# Patient Record
Sex: Female | Born: 1955 | Race: White | Hispanic: No | Marital: Married | Smoking: Current some day smoker
Health system: Southern US, Community
[De-identification: ages and names within clinical notes are randomized; demographics above are authoritative.]

## PROBLEM LIST (undated history)

## (undated) DIAGNOSIS — M1990 Unspecified osteoarthritis, unspecified site: Secondary | ICD-10-CM

## (undated) HISTORY — DX: Unspecified osteoarthritis, unspecified site: M19.90

---

## 1999-05-08 ENCOUNTER — Inpatient Hospital Stay (HOSPITAL_COMMUNITY): Admission: AD | Admit: 1999-05-08 | Discharge: 1999-05-08 | Payer: Self-pay | Admitting: Obstetrics & Gynecology

## 2000-04-09 ENCOUNTER — Inpatient Hospital Stay (HOSPITAL_COMMUNITY): Admission: EM | Admit: 2000-04-09 | Discharge: 2000-04-09 | Payer: Self-pay | Admitting: *Deleted

## 2001-12-16 ENCOUNTER — Other Ambulatory Visit: Admission: RE | Admit: 2001-12-16 | Discharge: 2001-12-16 | Payer: Self-pay | Admitting: Gynecology

## 2002-06-06 ENCOUNTER — Ambulatory Visit (HOSPITAL_COMMUNITY): Admission: RE | Admit: 2002-06-06 | Discharge: 2002-06-06 | Payer: Self-pay | Admitting: General Surgery

## 2002-06-06 ENCOUNTER — Encounter: Payer: Self-pay | Admitting: General Surgery

## 2002-06-21 ENCOUNTER — Ambulatory Visit: Admission: RE | Admit: 2002-06-21 | Discharge: 2002-08-25 | Payer: Self-pay | Admitting: Radiation Oncology

## 2002-06-28 ENCOUNTER — Encounter: Payer: Self-pay | Admitting: *Deleted

## 2002-06-28 ENCOUNTER — Ambulatory Visit (HOSPITAL_COMMUNITY): Admission: RE | Admit: 2002-06-28 | Discharge: 2002-06-28 | Payer: Self-pay | Admitting: *Deleted

## 2002-07-10 ENCOUNTER — Encounter: Payer: Self-pay | Admitting: *Deleted

## 2002-07-10 ENCOUNTER — Ambulatory Visit (HOSPITAL_COMMUNITY): Admission: RE | Admit: 2002-07-10 | Discharge: 2002-07-10 | Payer: Self-pay | Admitting: *Deleted

## 2002-09-20 ENCOUNTER — Encounter: Payer: Self-pay | Admitting: *Deleted

## 2002-09-20 ENCOUNTER — Ambulatory Visit (HOSPITAL_COMMUNITY): Admission: RE | Admit: 2002-09-20 | Discharge: 2002-09-20 | Payer: Self-pay | Admitting: *Deleted

## 2002-10-20 ENCOUNTER — Ambulatory Visit (HOSPITAL_COMMUNITY): Admission: RE | Admit: 2002-10-20 | Discharge: 2002-10-20 | Payer: Self-pay | Admitting: General Surgery

## 2004-05-13 ENCOUNTER — Emergency Department (HOSPITAL_COMMUNITY): Admission: EM | Admit: 2004-05-13 | Discharge: 2004-05-13 | Payer: Self-pay | Admitting: Emergency Medicine

## 2004-06-26 ENCOUNTER — Ambulatory Visit (HOSPITAL_COMMUNITY): Admission: RE | Admit: 2004-06-26 | Discharge: 2004-06-26 | Payer: Self-pay | Admitting: Neurology

## 2004-09-05 ENCOUNTER — Ambulatory Visit (HOSPITAL_COMMUNITY): Admission: RE | Admit: 2004-09-05 | Discharge: 2004-09-05 | Payer: Self-pay | Admitting: Neurology

## 2004-09-15 ENCOUNTER — Emergency Department (HOSPITAL_COMMUNITY): Admission: EM | Admit: 2004-09-15 | Discharge: 2004-09-16 | Payer: Self-pay | Admitting: Emergency Medicine

## 2004-09-16 ENCOUNTER — Ambulatory Visit: Payer: Self-pay | Admitting: Cardiology

## 2004-09-25 ENCOUNTER — Ambulatory Visit: Payer: Self-pay

## 2004-09-25 ENCOUNTER — Ambulatory Visit: Payer: Self-pay | Admitting: Cardiology

## 2004-10-20 ENCOUNTER — Encounter: Admission: RE | Admit: 2004-10-20 | Discharge: 2005-01-18 | Payer: Self-pay | Admitting: Neurology

## 2004-10-30 ENCOUNTER — Ambulatory Visit: Payer: Self-pay | Admitting: Cardiology

## 2004-12-31 ENCOUNTER — Ambulatory Visit (HOSPITAL_COMMUNITY): Admission: RE | Admit: 2004-12-31 | Discharge: 2004-12-31 | Payer: Self-pay | Admitting: Neurology

## 2005-04-20 ENCOUNTER — Ambulatory Visit: Payer: Self-pay | Admitting: Oncology

## 2006-02-17 ENCOUNTER — Encounter: Payer: Self-pay | Admitting: *Deleted

## 2006-04-08 ENCOUNTER — Ambulatory Visit: Payer: Self-pay | Admitting: Oncology

## 2010-11-09 ENCOUNTER — Encounter: Payer: Self-pay | Admitting: Oncology

## 2012-03-22 ENCOUNTER — Ambulatory Visit (INDEPENDENT_AMBULATORY_CARE_PROVIDER_SITE_OTHER): Payer: PRIVATE HEALTH INSURANCE | Admitting: Physician Assistant

## 2012-03-22 ENCOUNTER — Other Ambulatory Visit: Payer: Self-pay | Admitting: Physician Assistant

## 2012-03-22 ENCOUNTER — Ambulatory Visit: Payer: PRIVATE HEALTH INSURANCE

## 2012-03-22 VITALS — BP 135/74 | HR 78 | Temp 98.8°F | Resp 18 | Ht 66.0 in | Wt 185.0 lb

## 2012-03-22 DIAGNOSIS — M2550 Pain in unspecified joint: Secondary | ICD-10-CM

## 2012-03-22 LAB — POCT CBC
Granulocyte percent: 56.5 %G (ref 37–80)
MCH, POC: 29.8 pg (ref 27–31.2)
MCHC: 32.1 g/dL (ref 31.8–35.4)
MCV: 92.7 fL (ref 80–97)
RDW, POC: 13 %
WBC: 5.5 10*3/uL (ref 4.6–10.2)

## 2012-03-22 LAB — COMPREHENSIVE METABOLIC PANEL
ALT: 17 U/L (ref 0–35)
Albumin: 4 g/dL (ref 3.5–5.2)
Alkaline Phosphatase: 113 U/L (ref 39–117)
CO2: 27 mEq/L (ref 19–32)
Glucose, Bld: 97 mg/dL (ref 70–99)
Sodium: 141 mEq/L (ref 135–145)
Total Protein: 7.1 g/dL (ref 6.0–8.3)

## 2012-03-22 LAB — URIC ACID: Uric Acid, Serum: 4.9 mg/dL (ref 2.4–7.0)

## 2012-03-22 MED ORDER — INDOMETHACIN 50 MG PO CAPS: 50.0000 mg | ORAL_CAPSULE | Freq: Three times a day (TID) | ORAL | Status: AC

## 2012-03-22 NOTE — Progress Notes (Signed)
  Subjective:    Patient ID: Anita Young, female    DOB: April 17, 1956, 56 y.o.   MRN: [id number]  HPI Ms. Strohecker comes in today complaining of joint swelling and pain of right hand for 2 days.  She notes that most swelling and pain is in the 2nd MCP and then the 3rd MCP.  This has never happened before and she does not remember an injury. She denies any other arthralgias, fever or chills, myalgias. She says it feels a little warm and is most painful and stiff first thing in the morning.  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 She says she is in good health and only takes Vit D but she has not had routine exams and lab work recently.    Review of Systems As noted above in HPI    Objective:   Physical Exam  Constitutional: She is oriented to person, place, and time. She appears well-developed and well-nourished.  Cardiovascular: Normal rate and regular rhythm.   Pulmonary/Chest: Effort normal and breath sounds normal.  Musculoskeletal:       Right hand: She exhibits tenderness, bony tenderness and swelling. She exhibits normal capillary refill, no deformity and no laceration. normal sensation noted. Normal strength noted.       2nd and 3rd MCP with swelling noted.  Area is tender to light touch and she notes pain with flexion of joints. Minimal if any erythema and 2nd MCP slightly warm.  No other joints noted to be swollen.     Neurological: She is alert and oriented to person, place, and time.  Skin: No ecchymosis noted. No erythema.    [facility] reading (PRIMARY) by  Dr. Patsy Lager. Right hand Negative   Results for orders placed in visit on 03/22/12  POCT CBC      Component Value Range   WBC 5.5  4.6 - 10.2 (K/uL)   Lymph, poc 1.8  0.6 - 3.4    POC LYMPH PERCENT 33.6  10 - 50 (%L)   MID (cbc) 0.5  0 - 0.9    POC MID % 9.9  0 - 12 (%M)   POC Granulocyte 3.1  2 - 6.9    Granulocyte percent 56.5  37 - 80 (%G)   RBC 4.13  4.04 - 5.48 (M/uL)   Hemoglobin  12.3  12.2 - 16.2 (g/dL)   HCT, POC 40.9  81.1 - 47.9 (%)   MCV 92.7  80 - 97 (fL)   MCH, POC 29.8  27 - 31.2 (pg)   MCHC 32.1  31.8 - 35.4 (g/dL)   RDW, POC 91.4     Platelet Count, POC 209  142 - 424 (K/uL)   MPV 8.9  0 - 99.8 (fL)       Assessment & Plan:  Arthralgias, 2nd and 3rd right MCP, ? Gout  CMET, Uric Acid, Sed Rate Indocin 50 tid with food.   Gout information provided Return if symptoms worsen.

## 2012-03-22 NOTE — Patient Instructions (Signed)
Take indomethacin for pain and swelling as directed with food.   Call or return if symptoms worsen.  Gout Gout is an inflammatory condition (arthritis) caused by a buildup of uric acid crystals in the joints. Uric acid is a chemical that is normally present in the blood. Under some circumstances, uric acid can form into crystals in your joints. This causes joint redness, soreness, and swelling (inflammation). Repeat attacks are common. Over time, uric acid crystals can form into masses (tophi) near a joint, causing disfigurement. Gout is treatable and often preventable. CAUSES  The disease begins with elevated levels of uric acid in the blood. Uric acid is produced by your body when it breaks down a naturally found substance called purines. This also happens when you eat certain foods such as meats and fish. Causes of an elevated uric acid level include:  Being passed down from parent to child (heredity).   Diseases that cause increased uric acid production (obesity, psoriasis, some cancers).   Excessive alcohol use.   Diet, especially diets rich in meat and seafood.   Medicines, including certain cancer-fighting drugs (chemotherapy), diuretics, and aspirin.   Chronic kidney disease. The kidneys are no longer able to remove uric acid well.   Problems with metabolism.  Conditions strongly associated with gout include:  Obesity.   High blood pressure.   High cholesterol.   Diabetes.  Not everyone with elevated uric acid levels gets gout. It is not understood why some people get gout and others do not. Surgery, joint injury, and eating too much of certain foods are some of the factors that can lead to gout. SYMPTOMS   An attack of gout comes on quickly. It causes intense pain with redness, swelling, and warmth in a joint.   Fever can occur.   Often, only one joint is involved. Certain joints are more commonly involved:   Base of the big toe.   Knee.   Ankle.   Wrist.    Finger.  Without treatment, an attack usually goes away in a few days to weeks. Between attacks, you usually will not have symptoms, which is different from many other forms of arthritis. DIAGNOSIS  Your caregiver will suspect gout based on your symptoms and exam. Removal of fluid from the joint (arthrocentesis) is done to check for uric acid crystals. Your caregiver will give you a medicine that numbs the area (local anesthetic) and use a needle to remove joint fluid for exam. Gout is confirmed when uric acid crystals are seen in joint fluid, using a special microscope. Sometimes, blood, urine, and X-ray tests are also used. TREATMENT  There are 2 phases to gout treatment: treating the sudden onset (acute) attack and preventing attacks (prophylaxis). Treatment of an Acute Attack  Medicines are used. These include anti-inflammatory medicines or steroid medicines.   An injection of steroid medicine into the affected joint is sometimes necessary.   The painful joint is rested. Movement can worsen the arthritis.   You may use warm or cold treatments on painful joints, depending which works best for you.   Discuss the use of coffee, vitamin C, or cherries with your caregiver. These may be helpful treatment options.  Treatment to Prevent Attacks After the acute attack subsides, your caregiver may advise prophylactic medicine. These medicines either help your kidneys eliminate uric acid from your body or decrease your uric acid production. You may need to stay on these medicines for a very long time. The early phase of treatment with prophylactic  medicine can be associated with an increase in acute gout attacks. For this reason, during the first few months of treatment, your caregiver may also advise you to take medicines usually used for acute gout treatment. Be sure you understand your caregiver's directions. You should also discuss dietary treatment with your caregiver. Certain foods such as  meats and fish can increase uric acid levels. Other foods such as dairy can decrease levels. Your caregiver can give you a list of foods to avoid. HOME CARE INSTRUCTIONS   Do not take aspirin to relieve pain. This raises uric acid levels.   Only take over-the-counter or prescription medicines for pain, discomfort, or fever as directed by your caregiver.   Rest the joint as much as possible. When in bed, keep sheets and blankets off painful areas.   Keep the affected joint raised (elevated).   Use crutches if the painful joint is in your leg.   Drink enough water and fluids to keep your urine clear or pale yellow. This helps your body get rid of uric acid. Do not drink alcoholic beverages. They slow the passage of uric acid.   Follow your caregiver's dietary instructions. Pay careful attention to the amount of protein you eat. Your daily diet should emphasize fruits, vegetables, whole grains, and fat-free or low-fat milk products.   Maintain a healthy body weight.  SEEK MEDICAL CARE IF:   You have an oral temperature above 102 F (38.9 C).   You develop diarrhea, vomiting, or any side effects from medicines.   You do not feel better in 24 hours, or you are getting worse.  SEEK IMMEDIATE MEDICAL CARE IF:   Your joint becomes suddenly more tender and you have:   Chills.   An oral temperature above 102 F (38.9 C), not controlled by medicine.  MAKE SURE YOU:   Understand these instructions.   Will watch your condition.   Will get help right away if you are not doing well or get worse.  Document Released: 10/02/2000 Document Revised: 09/24/2011 Document Reviewed: 01/13/2010 [facility] Patient Information 2012 [place], [place].

## 2012-03-23 LAB — RHEUMATOID FACTOR: Rhuematoid fact SerPl-aCnc: 503 IU/mL — ABNORMAL HIGH (ref <=14)

## 2012-03-24 ENCOUNTER — Telehealth: Payer: Self-pay

## 2012-03-24 LAB — ANA: Anti Nuclear Antibody(ANA): POSITIVE — AB

## 2012-03-24 LAB — ANTI-NUCLEAR AB-TITER (ANA TITER): ANA Titer 1: 1:40 {titer} — ABNORMAL HIGH

## 2012-03-24 NOTE — Telephone Encounter (Signed)
Pt CB to get lab results. I explained the results to date and that Helmut Muster has ordered some add'l tests. Pt stated that she is feeling about the same as when she was here and will wait to hear about add'l tests when they are back. See notes under lab results.

## 2012-03-26 ENCOUNTER — Encounter: Payer: Self-pay | Admitting: Radiology

## 2012-04-01 ENCOUNTER — Telehealth: Payer: Self-pay

## 2012-04-01 DIAGNOSIS — R768 Other specified abnormal immunological findings in serum: Secondary | ICD-10-CM

## 2012-04-01 NOTE — Telephone Encounter (Signed)
Patient notified about elevated sed rate, rheum factor and positive ANA.  Of with referral to rheum and does not have preference.  Will refer.

## 2012-04-01 NOTE — Telephone Encounter (Signed)
Pt states that we sent her a letter and she is wanting someone to give her a call back regarding her lab results.  Patient's states that it is best to call her after 12pm.

## 2012-04-02 ENCOUNTER — Ambulatory Visit (INDEPENDENT_AMBULATORY_CARE_PROVIDER_SITE_OTHER): Payer: PRIVATE HEALTH INSURANCE | Admitting: Physician Assistant

## 2012-04-02 VITALS — BP 144/74 | HR 101 | Temp 98.9°F | Resp 16 | Ht 64.0 in | Wt 185.0 lb

## 2012-04-02 DIAGNOSIS — M79609 Pain in unspecified limb: Secondary | ICD-10-CM

## 2012-04-02 DIAGNOSIS — M79643 Pain in unspecified hand: Secondary | ICD-10-CM

## 2012-04-02 MED ORDER — PREDNISONE 20 MG PO TABS: ORAL_TABLET | ORAL | Status: DC

## 2012-04-02 MED ORDER — HYDROCODONE-ACETAMINOPHEN 5-325 MG PO TABS: 1.0000 | ORAL_TABLET | ORAL | Status: AC | PRN

## 2012-04-02 NOTE — Patient Instructions (Signed)
Rheumatoid Arthritis Rheumatoid arthritis is a long-term (chronic) inflammatory disease that causes pain, swelling, and stiffness of the joints. It can affect the entire body. The effects of rheumatoid arthritis vary widely among those with the condition. CAUSES  The cause of rheumatoid arthritis is not known. It tends to run in families and is more common in women. Certain cells of the body's natural defense system (immune system) do not work properly and begin to attack healthy joints. It primarily involves the connective tissue that lines the joints (synovial membrane). This can cause damage to the joint. SYMPTOMS   Pain, stiffness, swelling, and decreased motion of many joints, especially in the hands and feet.   Stiffness that is worse in the morning. It may last 1 to 2 hours or longer.   Fatigue.   Loss of appetite.   Low-grade fever.   Dry eyes and mouth.   Firm lumps (rheumatoid nodules) that grow beneath the skin in areas such as the elbows and hands.  DIAGNOSIS  Diagnosis is based on the symptoms described, exam, and blood tests. Sometimes, X-rays are helpful. TREATMENT  The goals of treatment are to relieve pain, reduce inflammation, and slow down or stop joint damage and disability. Methods vary and may include:  Maintaining a balance of rest, exercise, and proper nutrition.   Medicines:   Pain relievers (analgesics).   Corticosteroids and nonsteroidal anti-inflammatory drugs (NSAIDs) to reduce inflammation.   Disease-modifying antirheumatic drugs (DMARDs) to try to slow the course of the disease.   Biologic response modifiers to reduce inflammation and damage.   Surgery for patients with severe joint damage. Joint replacement or fusing of joints may be needed.   Routine monitoring and ongoing care, such as office visits, blood and urine tests, and x-rays.  HOME CARE INSTRUCTIONS   Remain physically active and reduce activity when the disease gets worse.   Eat a  well-balanced diet.   Use heat on affected joints when you wake up and before activities.   Use ice on affected joints following activities or exercising.   Take all medicines and supplements as directed by your caregiver.   Use splints as your caregiver prescribes. Splints help maintain joint position and function.   Do not sleep with pillows under your knees. This may lead to spasms.   Participate in a self-management program to keep current with the latest treatment and coping skills.  SEEK IMMEDIATE MEDICAL CARE IF:  You have fainting episodes.   You have periods of extreme weakness.   A hot, painful joint develops rapidly and is more severe than usual joint aches.   You develop chills.   You have a fever.  FOR MORE INFORMATION  American College of Rheumatology: www.rheumatology.org Arthritis Foundation: www.arthritis.org Document Released: 10/02/2000 Document Revised: 09/24/2011 Document Reviewed: 01/16/2010 [facility] Patient Information 2012 [place], [place].  Lupus Lupus (also called systemic lupus erythematosus, SLE) is a disorder of the body's natural defense system (immune system). In lupus, the immune system attacks various areas of the body (autoimmune disease). CAUSES The cause is unknown. However, lupus runs in families. Certain genes can make you more likely to develop lupus. It is 10 times more common in women than in men. Lupus is also more common in African Americans and Asians. Other factors also play a role, such as viruses (Epstein-Barr virus, EBV), stress, hormones, cigarette smoke, and certain drugs. SYMPTOMS Lupus can affect many parts of the body, including the joints, skin, kidneys, lungs, heart, nervous system, and blood vessels.  The signs and symptoms of lupus differ from person to person. The disease can range from mild to life-threatening. Typical features of lupus include:  Butterfly-shaped rash over the face.   Arthritis involving one or more  joints.   Kidney disease.   Fever, weight loss, hair loss, fatigue.   Poor circulation in the fingers and toes (Raynaud's disease).   Chest pain when taking deep breaths. Abdominal pain may also occur.   Skin rash in areas exposed to the sun.   Sores in the mouth and nose.  DIAGNOSIS Diagnosing lupus can take a long time and is often difficult. An exam and an accurate account of your symptoms and health problems is very important. Blood tests are necessary, though no single test can confirm or rule out lupus. Most people with lupus test positive for antinuclear antibodies (ANA) on a blood test. Additional blood tests, a urine test (urinalysis), and sometimes a kidney or skin tissue sample (biopsy) can help to confirm or rule out lupus. TREATMENT There is no cure for lupus. Your caregiver will develop a treatment plan based on your age, sex, health, symptoms, and lifestyle. The goals are to prevent flares, to treat them when they do occur, and to minimize organ damage and complications. How the disease may affect each person varies widely. Most people with lupus can live normal lives, but this disorder must be carefully monitored. Treatment must be adjusted as necessary to prevent serious complications. Medicines used for treatment:  Nonsteroidal anti-inflammatory drugs (NSAIDs) decrease inflammation and can help with chest pain, joint pain, and fevers. Examples include ibuprofen and naproxen.   Antimalarial drugs were designed to treat malaria. They also treat fatigue, joint pain, skin rashes, and inflammation of the lungs in patients with lupus.   Corticosteroids are powerful hormones that rapidly suppress inflammation. The lowest dose with the highest benefit will be chosen. They can be given by cream, pills, injections, and through the vein (intravenously).   Immunosuppressive drugs block the making of immune cells. They may be used for kidney or nerve disease.  HOME CARE  INSTRUCTIONS  Exercise. Low-impact activities can usually help keep joints flexible without being too strenuous.   Rest after periods of exercise.   Avoid excessive sun exposure.   Follow proper nutrition and take supplements as recommended by your caregiver.   Stress management can be helpful.  SEEK MEDICAL CARE IF:  You have increased fatigue.   You develop pain.   You develop a rash.   You have an oral temperature above 102 F (38.9 C).   You develop abdominal discomfort.   You develop a headache.   You experience dizziness.  FOR MORE INFORMATION National Institute of Neurological Disorders and Stroke: [employer] of Rheumatology: www.rheumatology.[employer] of Arthritis and Musculoskeletal and Skin Diseases: www.niams.http://www.myers.net/ Document Released: 09/25/2002 Document Revised: 09/24/2011 Document Reviewed: 01/16/2010 [facility] Patient Information 2012 [place], [place].

## 2012-04-02 NOTE — Progress Notes (Signed)
  Subjective:    Patient ID: Anita Young, female    DOB: 1956-08-11, 56 y.o.   MRN: [id number]  HPI Anita Young comes in today for follow up of her right hand pain.  Since her last visit she has taken the Indocin with some relief but her pain is worse now.  She was contacted several times to notify her of lab results but she was out of town so she was not aware of the positive RF and ANA found at the last visit.  She denies any history of previous joint problems and notes that there is no known family history of arthritis. Her right hand, 2nd MCP is painful and swollen.  No redness but feels warm.  She has not had a complete physical in years.  Review of Systems As noted in HPI    Objective:   Physical Exam  Right hand, 2nd MCP with swelling and tenderness on palpation.  Pain with ROM.  No warmth or redness.      Assessment & Plan:  Hand pain Hand swelling with posititve ANA and RF  Prednisone taper and Hydrocodone for pain. Refer to rheumatology Needs CPE

## 2012-04-06 ENCOUNTER — Ambulatory Visit (INDEPENDENT_AMBULATORY_CARE_PROVIDER_SITE_OTHER): Payer: PRIVATE HEALTH INSURANCE | Admitting: Physician Assistant

## 2012-04-06 VITALS — BP 148/79 | HR 74 | Temp 98.1°F | Resp 17 | Ht 64.5 in | Wt 185.0 lb

## 2012-04-06 DIAGNOSIS — R232 Flushing: Secondary | ICD-10-CM

## 2012-04-06 DIAGNOSIS — T887XXA Unspecified adverse effect of drug or medicament, initial encounter: Secondary | ICD-10-CM

## 2012-04-06 NOTE — Progress Notes (Signed)
  Subjective:    Patient ID: Anita Young, female    DOB: 10-05-1956, 56 y.o.   MRN: [id number]  HPI Ms. Anita Young comes in today after waking this morning with swollen red face. No pain or itching. She is better now but was concerned.  She has no fever or chills.  No rash other areas.  No edema other areas.  She is now taking 2 Prednisone 20 mg tablets for her hand pain.  She has not taken her prednisone today.  Her hand is much better today.  Her rheumatology appointment has not been scheduled yet.   Review of Systems As above in HPI    Objective:   Physical Exam  Constitutional: She appears well-developed and well-nourished.  HENT:  Mouth/Throat: Oropharynx is clear and moist.  Cardiovascular: Normal rate and regular rhythm.   Pulmonary/Chest: Effort normal and breath sounds normal.  Musculoskeletal:       No edema   Skin: No rash noted. There is erythema.       Faint redness on bilateral cheeks and forehead.  No obvious swelling noted although pt says she is swollen. No rash other areas.       Assessment & Plan:  Medication SE Flushing  Feel her symptoms are SE of Prednisone.  I recommended her to just take 1 prednisone tablet today and then d/c.  If she has continued symptoms or new symptoms, she is to RTC.  Reviewed red flag symptoms.

## 2012-06-07 ENCOUNTER — Encounter: Payer: Self-pay | Admitting: Physician Assistant

## 2012-06-07 DIAGNOSIS — M2550 Pain in unspecified joint: Secondary | ICD-10-CM | POA: Insufficient documentation

## 2012-06-07 DIAGNOSIS — R768 Other specified abnormal immunological findings in serum: Secondary | ICD-10-CM | POA: Insufficient documentation

## 2012-09-20 ENCOUNTER — Ambulatory Visit (INDEPENDENT_AMBULATORY_CARE_PROVIDER_SITE_OTHER): Payer: PRIVATE HEALTH INSURANCE | Admitting: Internal Medicine

## 2012-09-20 VITALS — BP 156/92 | HR 108 | Temp 98.0°F | Resp 17 | Ht 64.0 in | Wt 188.0 lb

## 2012-09-20 DIAGNOSIS — R42 Dizziness and giddiness: Secondary | ICD-10-CM

## 2012-09-20 DIAGNOSIS — R079 Chest pain, unspecified: Secondary | ICD-10-CM

## 2012-09-20 DIAGNOSIS — R1110 Vomiting, unspecified: Secondary | ICD-10-CM

## 2012-09-20 DIAGNOSIS — Z87891 Personal history of nicotine dependence: Secondary | ICD-10-CM

## 2012-09-20 DIAGNOSIS — R197 Diarrhea, unspecified: Secondary | ICD-10-CM

## 2012-09-20 DIAGNOSIS — I10 Essential (primary) hypertension: Secondary | ICD-10-CM

## 2012-09-20 LAB — POCT URINALYSIS DIPSTICK
Leukocytes, UA: NEGATIVE
Protein, UA: 30

## 2012-09-20 LAB — POCT CBC
HCT, POC: 48.1 % — AB (ref 37.7–47.9)
Hemoglobin: 14.4 g/dL (ref 12.2–16.2)
MCV: 98.4 fL — AB (ref 80–97)
MPV: 9.5 fL (ref 0–99.8)
POC Granulocyte: 3.8 (ref 2–6.9)
POC MID %: 6.6 %M (ref 0–12)
Platelet Count, POC: 230 10*3/uL (ref 142–424)
RDW, POC: 13 %

## 2012-09-20 LAB — COMPREHENSIVE METABOLIC PANEL
Albumin: 4 g/dL (ref 3.5–5.2)
Alkaline Phosphatase: 111 U/L (ref 39–117)
BUN: 18 mg/dL (ref 6–23)
CO2: 26 mEq/L (ref 19–32)
Calcium: 9.5 mg/dL (ref 8.4–10.5)
Chloride: 106 mEq/L (ref 96–112)
Sodium: 143 mEq/L (ref 135–145)
Total Bilirubin: 0.4 mg/dL (ref 0.3–1.2)

## 2012-09-20 LAB — POCT UA - MICROSCOPIC ONLY
Mucus, UA: POSITIVE
Yeast, UA: NEGATIVE

## 2012-09-20 MED ORDER — METOPROLOL SUCCINATE ER 25 MG PO TB24: 25.0000 mg | ORAL_TABLET | Freq: Every day | ORAL | Status: DC

## 2012-09-20 MED ORDER — ONDANSETRON 4 MG PO TBDP
8.0000 mg | ORAL_TABLET | Freq: Once | ORAL | Status: AC
  Administered 2012-09-20: 8 mg via ORAL

## 2012-09-20 MED ORDER — ONDANSETRON HCL 8 MG PO TABS: 8.0000 mg | ORAL_TABLET | Freq: Three times a day (TID) | ORAL | Status: DC | PRN

## 2012-09-20 NOTE — Progress Notes (Signed)
  Subjective:    Patient ID: Anita Young, female    DOB: 1956/03/23, 56 y.o.   MRN: [id number]  HPI Vomiting, diarrhea, dizzy, chest pressure of gradual onset last night. No fhx of heart disease. Needs note for work BP elevation every visit  Review of Systems     Objective:   Physical Exam 156/92  Results for orders placed in visit on 09/20/12  POCT CBC      Component Value Range   WBC 6.0  4.6 - 10.2 K/uL   Lymph, poc 1.8  0.6 - 3.4   POC LYMPH PERCENT 30.7  10 - 50 %L   MID (cbc) 0.4  0 - 0.9   POC MID % 6.6  0 - 12 %M   POC Granulocyte 3.8  2 - 6.9   Granulocyte percent 62.7  37 - 80 %G   RBC 4.89  4.04 - 5.48 M/uL   Hemoglobin 14.4  12.2 - 16.2 g/dL   HCT, POC 81.1 (*) 91.4 - 47.9 %   MCV 98.4 (*) 80 - 97 fL   MCH, POC 29.4  27 - 31.2 pg   MCHC 29.9 (*) 31.8 - 35.4 g/dL   RDW, POC 78.2     Platelet Count, POC 230  142 - 424 K/uL   MPV 9.5  0 - 99.8 fL  POCT URINALYSIS DIPSTICK      Component Value Range   Color, UA amber     Clarity, UA cloudy     Glucose, UA negative     Bilirubin, UA small     Ketones, UA trace     Spec Grav, UA >=1.030     Blood, UA trace/intact     pH, UA 6.0     Protein, UA 30     Urobilinogen, UA 0.2     Nitrite, UA negative     Leukocytes, UA Negative          Assessment & Plan:  Zofran 8mg  prn Reck 48 hrs 10am Hydrate Metoprolol er 25 mg qd Keep home bp record

## 2012-09-20 NOTE — Patient Instructions (Addendum)
DASH Diet The DASH diet stands for "Dietary Approaches to Stop Hypertension." It is a healthy eating plan that has been shown to reduce high blood pressure (hypertension) in as little as 14 days, while also possibly providing other significant health benefits. These other health benefits include reducing the risk of breast cancer after menopause and reducing the risk of type 2 diabetes, heart disease, colon cancer, and stroke. Health benefits also include weight loss and slowing kidney failure in patients with chronic kidney disease.  DIET GUIDELINES  Limit salt (sodium). Your diet should contain less than 1500 mg of sodium daily.  Limit refined or processed carbohydrates. Your diet should include mostly whole grains. Desserts and added sugars should be used sparingly.  Include small amounts of heart-healthy fats. These types of fats include nuts, oils, and tub margarine. Limit saturated and trans fats. These fats have been shown to be harmful in the body. CHOOSING FOODS  The following food groups are based on a 2000 calorie diet. See your Registered Dietitian for individual calorie needs. Grains and Grain Products (6 to 8 servings daily)  Eat More Often: Whole-wheat bread, brown rice, whole-grain or wheat pasta, quinoa, popcorn without added fat or salt (air popped).  Eat Less Often: White bread, white pasta, white rice, cornbread. Vegetables (4 to 5 servings daily)  Eat More Often: Fresh, frozen, and canned vegetables. Vegetables may be raw, steamed, roasted, or grilled with a minimal amount of fat.  Eat Less Often/Avoid: Creamed or fried vegetables. Vegetables in a cheese sauce. Fruit (4 to 5 servings daily)  Eat More Often: All fresh, canned (in natural juice), or frozen fruits. Dried fruits without added sugar. One hundred percent fruit juice ( cup [237 mL] daily).  Eat Less Often: Dried fruits with added sugar. Canned fruit in light or heavy syrup. [employer], Fish, and Poultry (2  servings or less daily. One serving is 3 to 4 oz [85-114 g]).  Eat More Often: Ninety percent or leaner ground beef, tenderloin, sirloin. Round cuts of beef, chicken breast, [place] breast. All fish. Grill, bake, or broil your meat. Nothing should be fried.  Eat Less Often/Avoid: Fatty cuts of meat, [place], or chicken leg, thigh, or wing. Fried cuts of meat or fish. Dairy (2 to 3 servings)  Eat More Often: Low-fat or fat-free milk, low-fat plain or light yogurt, reduced-fat or part-skim cheese.  Eat Less Often/Avoid: Milk (whole, 2%).Whole milk yogurt. Full-fat cheeses. Nuts, Seeds, and Legumes (4 to 5 servings per week)  Eat More Often: All without added salt.  Eat Less Often/Avoid: Salted nuts and seeds, canned beans with added salt. Fats and Sweets (limited)  Eat More Often: Vegetable oils, tub margarines without trans fats, sugar-free gelatin. Mayonnaise and salad dressings.  Eat Less Often/Avoid: Coconut oils, palm oils, butter, stick margarine, cream, half and half, cookies, candy, pie. FOR MORE INFORMATION The Dash Diet Eating Plan: www.dashdiet.org Document Released: 09/24/2011 Document Revised: 12/28/2011 Document Reviewed: 09/24/2011 [facility] Patient Information 2013 [place], [place]. Viral Gastroenteritis Viral gastroenteritis is also known as stomach flu. This condition affects the stomach and intestinal tract. It can cause sudden diarrhea and vomiting. The illness typically lasts 3 to 8 days. Most people develop an immune response that eventually gets rid of the virus. While this natural response develops, the virus can make you quite ill. CAUSES  Many different viruses can cause gastroenteritis, such as rotavirus or noroviruses. You can catch one of these viruses by consuming contaminated food or water. You may also catch  a virus by sharing utensils or other personal items with an infected person or by touching a contaminated surface. SYMPTOMS  The most common symptoms are  diarrhea and vomiting. These problems can cause a severe loss of body fluids (dehydration) and a body salt (electrolyte) imbalance. Other symptoms may include:  Fever.  Headache.  Fatigue.  Abdominal pain. DIAGNOSIS  Your caregiver can usually diagnose viral gastroenteritis based on your symptoms and a physical exam. A stool sample may also be taken to test for the presence of viruses or other infections. TREATMENT  This illness typically goes away on its own. Treatments are aimed at rehydration. The most serious cases of viral gastroenteritis involve vomiting so severely that you are not able to keep fluids down. In these cases, fluids must be given through an intravenous line (IV). HOME CARE INSTRUCTIONS   Drink enough fluids to keep your urine clear or pale yellow. Drink small amounts of fluids frequently and increase the amounts as tolerated.  Ask your caregiver for specific rehydration instructions.  Avoid:  Foods high in sugar.  Alcohol.  Carbonated drinks.  Tobacco.  Juice.  Caffeine drinks.  Extremely hot or cold fluids.  Fatty, greasy foods.  Too much intake of anything at one time.  Dairy products until 24 to 48 hours after diarrhea stops.  You may consume probiotics. Probiotics are active cultures of beneficial bacteria. They may lessen the amount and number of diarrheal stools in adults. Probiotics can be found in yogurt with active cultures and in supplements.  Wash your hands well to avoid spreading the virus.  Only take over-the-counter or prescription medicines for pain, discomfort, or fever as directed by your caregiver. Do not give aspirin to children. Antidiarrheal medicines are not recommended.  Ask your caregiver if you should continue to take your regular prescribed and over-the-counter medicines.  Keep all follow-up appointments as directed by your caregiver. SEEK IMMEDIATE MEDICAL CARE IF:   You are unable to keep fluids down.  You do not  urinate at least once every 6 to 8 hours.  You develop shortness of breath.  You notice blood in your stool or vomit. This may look like coffee grounds.  You have abdominal pain that increases or is concentrated in one small area (localized).  You have persistent vomiting or diarrhea.  You have a fever.  The patient is a child younger than 3 months, and he or she has a fever.  The patient is a child older than 3 months, and he or she has a fever and persistent symptoms.  The patient is a child older than 3 months, and he or she has a fever and symptoms suddenly get worse.  The patient is a baby, and he or she has no tears when crying. MAKE SURE YOU:   Understand these instructions.  Will watch your condition.  Will get help right away if you are not doing well or get worse. Document Released: 10/05/2005 Document Revised: 12/28/2011 Document Reviewed: 07/22/2011 [facility] Patient Information 2013 [place], [place]. Hypertension As your heart beats, it forces blood through your arteries. This force is your blood pressure. If the pressure is too high, it is called hypertension (HTN) or high blood pressure. HTN is dangerous because you may have it and not know it. High blood pressure may mean that your heart has to work harder to pump blood. Your arteries may be narrow or stiff. The extra work puts you at risk for heart disease, stroke, and other problems.  Blood pressure consists of two numbers, a higher number over a lower, 110/72, for example. It is stated as "110 over 72." The ideal is below 120 for the top number (systolic) and under 80 for the bottom (diastolic). Write down your blood pressure today. You should pay close attention to your blood pressure if you have certain conditions such as:  Heart failure.  Prior heart attack.  Diabetes  Chronic kidney disease.  Prior stroke.  Multiple risk factors for heart disease. To see if you have HTN, your blood pressure should be  measured while you are seated with your arm held at the level of the heart. It should be measured at least twice. A one-time elevated blood pressure reading (especially in the Emergency Department) does not mean that you need treatment. There may be conditions in which the blood pressure is different between your right and left arms. It is important to see your caregiver soon for a recheck. Most people have essential hypertension which means that there is not a specific cause. This type of high blood pressure may be lowered by changing lifestyle factors such as:  Stress.  Smoking.  Lack of exercise.  Excessive weight.  Drug/tobacco/alcohol use.  Eating less salt. Most people do not have symptoms from high blood pressure until it has caused damage to the body. Effective treatment can often prevent, delay or reduce that damage. TREATMENT  When a cause has been identified, treatment for high blood pressure is directed at the cause. There are a large number of medications to treat HTN. These fall into several categories, and your caregiver will help you select the medicines that are best for you. Medications may have side effects. You should review side effects with your caregiver. If your blood pressure stays high after you have made lifestyle changes or started on medicines,   Your medication(s) may need to be changed.  Other problems may need to be addressed.  Be certain you understand your prescriptions, and know how and when to take your medicine.  Be sure to follow up with your caregiver within the time frame advised (usually within two weeks) to have your blood pressure rechecked and to review your medications.  If you are taking more than one medicine to lower your blood pressure, make sure you know how and at what times they should be taken. Taking two medicines at the same time can result in blood pressure that is too low. SEEK IMMEDIATE MEDICAL CARE IF:  You develop a severe  headache, blurred or changing vision, or confusion.  You have unusual weakness or numbness, or a faint feeling.  You have severe chest or abdominal pain, vomiting, or breathing problems. MAKE SURE YOU:   Understand these instructions.  Will watch your condition.  Will get help right away if you are not doing well or get worse. Document Released: 10/05/2005 Document Revised: 12/28/2011 Document Reviewed: 05/25/2008 [facility] Patient Information 2013 [place], [place].

## 2012-09-22 ENCOUNTER — Ambulatory Visit (INDEPENDENT_AMBULATORY_CARE_PROVIDER_SITE_OTHER): Payer: PRIVATE HEALTH INSURANCE | Admitting: Internal Medicine

## 2012-09-22 VITALS — BP 140/80 | HR 88 | Temp 98.1°F | Resp 18 | Ht 64.0 in | Wt 186.0 lb

## 2012-09-22 DIAGNOSIS — I10 Essential (primary) hypertension: Secondary | ICD-10-CM

## 2012-09-22 DIAGNOSIS — Z79899 Other long term (current) drug therapy: Secondary | ICD-10-CM

## 2012-09-22 DIAGNOSIS — Z7189 Other specified counseling: Secondary | ICD-10-CM

## 2012-09-22 NOTE — Progress Notes (Signed)
  Subjective:    Patient ID: Anita Young, female    DOB: [birth year], 56 y.o.   MRN: [id number]  HPI Vomiting resolved HTN improved Tolerates meds   Review of Systems     Objective:   Physical Exam  Constitutional: She is oriented to person, place, and time. She appears well-nourished. No distress.  Eyes: EOM are normal.  Cardiovascular: Normal rate, regular rhythm, normal heart sounds and intact distal pulses.   Pulmonary/Chest: Effort normal.  Neurological: She is alert and oriented to person, place, and time. Coordination normal.  Psychiatric: She has a normal mood and affect.          Assessment & Plan:  HTN improved RTC 09/30/12 do bp and bmet

## 2012-09-23 ENCOUNTER — Encounter: Payer: Self-pay | Admitting: *Deleted

## 2013-02-24 ENCOUNTER — Emergency Department (HOSPITAL_COMMUNITY): Payer: Self-pay

## 2013-02-24 ENCOUNTER — Emergency Department (HOSPITAL_COMMUNITY)
Admission: EM | Admit: 2013-02-24 | Discharge: 2013-02-24 | Disposition: A | Discharge status: Home / Self Care | Payer: PRIVATE HEALTH INSURANCE | Attending: Emergency Medicine | Admitting: Emergency Medicine

## 2013-02-24 ENCOUNTER — Encounter (HOSPITAL_COMMUNITY): Payer: Self-pay

## 2013-02-24 DIAGNOSIS — Y9301 Activity, walking, marching and hiking: Secondary | ICD-10-CM | POA: Insufficient documentation

## 2013-02-24 DIAGNOSIS — Y9269 Other specified industrial and construction area as the place of occurrence of the external cause: Secondary | ICD-10-CM | POA: Insufficient documentation

## 2013-02-24 DIAGNOSIS — S060X9A Concussion with loss of consciousness of unspecified duration, initial encounter: Secondary | ICD-10-CM | POA: Insufficient documentation

## 2013-02-24 DIAGNOSIS — S069X9A Unspecified intracranial injury with loss of consciousness of unspecified duration, initial encounter: Secondary | ICD-10-CM

## 2013-02-24 DIAGNOSIS — Y990 Civilian activity done for income or pay: Secondary | ICD-10-CM | POA: Insufficient documentation

## 2013-02-24 DIAGNOSIS — F17200 Nicotine dependence, unspecified, uncomplicated: Secondary | ICD-10-CM | POA: Insufficient documentation

## 2013-02-24 DIAGNOSIS — IMO0002 Reserved for concepts with insufficient information to code with codable children: Secondary | ICD-10-CM | POA: Insufficient documentation

## 2013-02-24 DIAGNOSIS — Z79899 Other long term (current) drug therapy: Secondary | ICD-10-CM | POA: Insufficient documentation

## 2013-02-24 DIAGNOSIS — W010XXA Fall on same level from slipping, tripping and stumbling without subsequent striking against object, initial encounter: Secondary | ICD-10-CM | POA: Insufficient documentation

## 2013-02-24 DIAGNOSIS — Z8739 Personal history of other diseases of the musculoskeletal system and connective tissue: Secondary | ICD-10-CM | POA: Insufficient documentation

## 2013-02-24 DIAGNOSIS — W19XXXA Unspecified fall, initial encounter: Secondary | ICD-10-CM

## 2013-02-24 MED ORDER — KETOROLAC TROMETHAMINE 30 MG/ML IJ SOLN
30.0000 mg | Freq: Once | INTRAMUSCULAR | Status: AC
  Administered 2013-02-24: 30 mg via INTRAVENOUS
  Filled 2013-02-24: qty 1

## 2013-02-24 MED ORDER — FENTANYL CITRATE 0.05 MG/ML IJ SOLN
50.0000 ug | Freq: Once | INTRAMUSCULAR | Status: AC
  Administered 2013-02-24: 50 ug via INTRAVENOUS
  Filled 2013-02-24: qty 2

## 2013-02-24 NOTE — ED Notes (Signed)
Pt arrived via [facility] on [facility] with c-collar intact 

## 2013-02-24 NOTE — ED Notes (Signed)
Per [facility], pt walked into a cooler at work and slipped on something causing her to fall, hitting her neck on something. Pt states she lost consciousness. Staff reported she c/o HA after getting up and ambulating out of the cooler. EMS states that she appeared almost postictal on their arrival and she was only able to follow yes/no commands. Pt is disoriented to time but alert to person, situation, and place. 22g to [facility] and gave 4 mg zofran for nausea.

## 2013-02-24 NOTE — ED Provider Notes (Signed)
History     CSN: [id number]  Arrival date & time 02/24/13  3086   First MD Initiated Contact with Patient 02/24/13 [phone]      Chief Complaint  Patient presents with  . Fall    (Consider location/radiation/quality/duration/timing/severity/associated sxs/prior treatment) Patient is a 57 y.o. female presenting with fall.  Fall   Level 5 caveat due to confusion Pt reports she slipped and fell in the walk-in freezer at work this morning hitting her head and injuring her neck. EMS was called, they report she was confused. She does not remember details of the event, but apparently walked out of the freezer on her own.  Past Medical History  Diagnosis Date  . Arthritis     History reviewed. No pertinent past surgical history.  No family history on file.  History  Substance Use Topics  . Smoking status: Current Some Day Smoker    Start date: 07/21/2012  . Smokeless tobacco: Not on file  . Alcohol Use: No    OB History   Grav Para Term Preterm Abortions TAB SAB Ect Mult Living                  Review of Systems All other systems reviewed and are negative except as noted in HPI.   Allergies  Prednisone  Home Medications   Current Outpatient Rx  Name  Route  Sig  Dispense  Refill  . cholecalciferol (VITAMIN D) 1000 UNITS tablet   Oral   Take 1,000 Units by mouth daily.         . metoprolol succinate (TOPROL-XL) 25 MG 24 hr tablet   Oral   Take 1 tablet (25 mg total) by mouth daily.   90 tablet   3   . ondansetron (ZOFRAN) 8 MG tablet   Oral   Take 1 tablet (8 mg total) by mouth every 8 (eight) hours as needed for nausea.   20 tablet   0   . predniSONE (DELTASONE) 20 MG tablet      3,3,2,2,2,1,1,1,1,1 taper   17 tablet   0     BP 168/80  Pulse 83  Temp(Src) 98.2 F (36.8 C) (Oral)  Resp 18  SpO2 95%  Physical Exam  Nursing note and vitals reviewed. Constitutional: She appears well-developed and well-nourished.  HENT:  Head: Normocephalic and  atraumatic.  Eyes: EOM are normal. Pupils are equal, round, and reactive to light.  Neck:  Immobilized in C-collar, midline C-spine tenderness  Cardiovascular: Normal rate, normal heart sounds and intact distal pulses.   Pulmonary/Chest: Effort normal and breath sounds normal.  Abdominal: Bowel sounds are normal. She exhibits no distension. There is no tenderness.  Musculoskeletal: Normal range of motion. She exhibits tenderness (midline lumbar spine). She exhibits no edema.  Neurological: She is alert. She has normal strength. No cranial nerve deficit or sensory deficit. Coordination normal.  Oriented to person and place, not time  Skin: Skin is warm and dry. No rash noted.  Psychiatric: She has a normal mood and affect.    ED Course  Procedures (including critical care time)  Labs Reviewed - No data to display Dg Lumbar Spine Complete  02/24/2013  *RADIOLOGY REPORT*  Clinical Data: Recent traumatic injury with low back pain  LUMBAR SPINE - COMPLETE 4+ VIEW  Comparison: None.  Findings: Five lumbar type vertebral bodies are well visualized. Vertebral body height is well-maintained.  Mild osteophytic changes are noted at L3-4 as well as T12-L1.  No significant antero- or  retrolisthesis is noted.  Mild facet hypertrophic changes are noted.  IMPRESSION: Mild degenerative change without acute abnormality.   Original Report Authenticated By: Alcide Clever, M.D.    Ct Head Wo Contrast  02/24/2013  *RADIOLOGY REPORT*  Clinical Data:  Fall, head injury, confusion  CT HEAD WITHOUT CONTRAST CT CERVICAL SPINE WITHOUT CONTRAST  Technique:  Multidetector CT imaging of the head and cervical spine was performed following the standard protocol without intravenous contrast.  Multiplanar CT image reconstructions of the cervical spine were also generated.  Comparison:  Prior head CT and cervical spine CT 05/13/2004  CT HEAD  Findings: No acute intracranial hemorrhage, acute infarction, mass lesion, mass effect,  midline shift or hydrocephalus.  Gray-white differentiation is preserved throughout.  No focal soft tissue swelling or scalp hematoma.  No acute calvarial abnormality.  The globes and orbits are unremarkable.  Normal aeration of the mastoid air cells and maxillary sinuses.  IMPRESSION: Negative head CT  CT CERVICAL SPINE  Findings: No acute fracture, malalignment or prevertebral soft tissue swelling.  Mild asymmetry of the lateral atlantodental interval likely related to rotation of C1 on C2.  The absence of clinical torticollis, this is almost certainly positional.  Mild paraseptal emphysema in the left lung apex.  No acute soft tissue abnormality.  There is straightening of the normal cervical lordosis which is similar in appearance compared to the prior study.  Focal cervical spondylosis at C5-C6 without significant interval progression compared to prior.  IMPRESSION:  1.  No acute fracture or malalignment  2.  Cervical spondylosis at C5-C6 with straightening of the normal cervical lordosis is similar compared to 05/13/2004.   Original Report Authenticated By: Malachy Moan, M.D.    Ct Cervical Spine Wo Contrast  02/24/2013  *RADIOLOGY REPORT*  Clinical Data:  Fall, head injury, confusion  CT HEAD WITHOUT CONTRAST CT CERVICAL SPINE WITHOUT CONTRAST  Technique:  Multidetector CT imaging of the head and cervical spine was performed following the standard protocol without intravenous contrast.  Multiplanar CT image reconstructions of the cervical spine were also generated.  Comparison:  Prior head CT and cervical spine CT 05/13/2004  CT HEAD  Findings: No acute intracranial hemorrhage, acute infarction, mass lesion, mass effect, midline shift or hydrocephalus.  Gray-white differentiation is preserved throughout.  No focal soft tissue swelling or scalp hematoma.  No acute calvarial abnormality.  The globes and orbits are unremarkable.  Normal aeration of the mastoid air cells and maxillary sinuses.  IMPRESSION:  Negative head CT  CT CERVICAL SPINE  Findings: No acute fracture, malalignment or prevertebral soft tissue swelling.  Mild asymmetry of the lateral atlantodental interval likely related to rotation of C1 on C2.  The absence of clinical torticollis, this is almost certainly positional.  Mild paraseptal emphysema in the left lung apex.  No acute soft tissue abnormality.  There is straightening of the normal cervical lordosis which is similar in appearance compared to the prior study.  Focal cervical spondylosis at C5-C6 without significant interval progression compared to prior.  IMPRESSION:  1.  No acute fracture or malalignment  2.  Cervical spondylosis at C5-C6 with straightening of the normal cervical lordosis is similar compared to 05/13/2004.   Original Report Authenticated By: Malachy Moan, M.D.      1. Fall, initial encounter   2. Head injury, acute, with loss of consciousness, unspecified duration, initial encounter       MDM  Imaging negative. Pt with likely mild concussion. Given pain meds, will reassess for  clearing sensorium  2:11 PM Imaging unremarkable. More alert now but still feels some dizziness. She was able to ambulate without difficulty in the ED. Will discharge with close observation for head injury. PCP followup.        Charles B. Bernette Mayers, MD 02/24/13 [phone]

## 2013-02-24 NOTE — ED Notes (Signed)
Pt ambulated with assistance around the Nurse's station with occasional complaint of dizziness.Dr Bernette Mayers notified.

## 2013-02-24 NOTE — ED Notes (Signed)
Myslef and Hayley, RN assisted Bernette Mayers, MD with taking pt off LSB; undressed pt and placed in gown; placed pt on continuous pulse oximetry and blood pressure cuff; c-collar still intact; family at bedside

## 2013-02-24 NOTE — ED Notes (Signed)
Dr. Sheldon at the bedside.  

## 2013-03-11 ENCOUNTER — Emergency Department (HOSPITAL_COMMUNITY): Payer: Worker's Compensation

## 2013-03-11 ENCOUNTER — Emergency Department (HOSPITAL_COMMUNITY)
Admission: EM | Admit: 2013-03-11 | Discharge: 2013-03-11 | Disposition: A | Discharge status: Home / Self Care | Payer: Worker's Compensation | Attending: Emergency Medicine | Admitting: Emergency Medicine

## 2013-03-11 ENCOUNTER — Encounter (HOSPITAL_COMMUNITY): Payer: Self-pay | Admitting: Family Medicine

## 2013-03-11 DIAGNOSIS — H538 Other visual disturbances: Secondary | ICD-10-CM | POA: Insufficient documentation

## 2013-03-11 DIAGNOSIS — M129 Arthropathy, unspecified: Secondary | ICD-10-CM | POA: Insufficient documentation

## 2013-03-11 DIAGNOSIS — Z8669 Personal history of other diseases of the nervous system and sense organs: Secondary | ICD-10-CM | POA: Insufficient documentation

## 2013-03-11 DIAGNOSIS — H53149 Visual discomfort, unspecified: Secondary | ICD-10-CM | POA: Insufficient documentation

## 2013-03-11 DIAGNOSIS — R42 Dizziness and giddiness: Secondary | ICD-10-CM

## 2013-03-11 DIAGNOSIS — Z87828 Personal history of other (healed) physical injury and trauma: Secondary | ICD-10-CM | POA: Insufficient documentation

## 2013-03-11 DIAGNOSIS — R51 Headache: Secondary | ICD-10-CM

## 2013-03-11 DIAGNOSIS — R259 Unspecified abnormal involuntary movements: Secondary | ICD-10-CM | POA: Insufficient documentation

## 2013-03-11 DIAGNOSIS — R5381 Other malaise: Secondary | ICD-10-CM | POA: Insufficient documentation

## 2013-03-11 DIAGNOSIS — F17200 Nicotine dependence, unspecified, uncomplicated: Secondary | ICD-10-CM | POA: Insufficient documentation

## 2013-03-11 DIAGNOSIS — M542 Cervicalgia: Secondary | ICD-10-CM

## 2013-03-11 LAB — BASIC METABOLIC PANEL
BUN: 12 mg/dL (ref 6–23)
CO2: 27 mEq/L (ref 19–32)
Chloride: 103 mEq/L (ref 96–112)
GFR calc non Af Amer: >90 mL/min (ref >90)
Glucose, Bld: 107 mg/dL — ABNORMAL HIGH (ref 70–99)
Potassium: 4 mEq/L (ref 3.5–5.1)

## 2013-03-11 LAB — CBC WITH DIFFERENTIAL/PLATELET
Eosinophils Absolute: 0.1 10*3/uL (ref 0.0–0.7)
HCT: 38.5 % (ref 36.0–46.0)
Lymphs Abs: 1.4 10*3/uL (ref 0.7–4.0)
MCH: 31.7 pg (ref 26.0–34.0)
Monocytes Relative: 8 % (ref 3–12)
Neutrophils Relative %: 68 % (ref 43–77)
WBC: 6.4 10*3/uL (ref 4.0–10.5)

## 2013-03-11 MED ORDER — MECLIZINE HCL 25 MG PO TABS
25.0000 mg | ORAL_TABLET | Freq: Once | ORAL | Status: AC
  Administered 2013-03-11: 25 mg via ORAL
  Filled 2013-03-11: qty 1

## 2013-03-11 MED ORDER — MECLIZINE HCL 25 MG PO TABS: 25.0000 mg | ORAL_TABLET | Freq: Three times a day (TID) | ORAL | Status: DC | PRN

## 2013-03-11 MED ORDER — SODIUM CHLORIDE 0.9 % IV BOLUS (SEPSIS)
1000.0000 mL | Freq: Once | INTRAVENOUS | Status: AC
  Administered 2013-03-11: 1000 mL via INTRAVENOUS

## 2013-03-11 MED ORDER — HYDROMORPHONE HCL PF 1 MG/ML IJ SOLN
1.0000 mg | Freq: Once | INTRAMUSCULAR | Status: AC
  Administered 2013-03-11: 1 mg via INTRAVENOUS
  Filled 2013-03-11: qty 1

## 2013-03-11 MED ORDER — OXYCODONE-ACETAMINOPHEN 5-325 MG PO TABS: 1.0000 | ORAL_TABLET | ORAL | Status: DC | PRN

## 2013-03-11 MED ORDER — METOCLOPRAMIDE HCL 5 MG/ML IJ SOLN
10.0000 mg | Freq: Once | INTRAMUSCULAR | Status: AC
  Administered 2013-03-11: 10 mg via INTRAVENOUS
  Filled 2013-03-11: qty 2

## 2013-03-11 NOTE — ED Notes (Signed)
Pt states she is still waiting to see neurologist, but pt is c/o headache and need for medication refill. Pt states she is also dizzy. Pt denies falling besides initial injury. No neuro deficits noted. Denies cp.

## 2013-03-11 NOTE — ED Provider Notes (Signed)
History     CSN: [id number]  Arrival date & time 03/11/13  1047   First MD Initiated Contact with Patient 03/11/13 1139      Chief Complaint  Patient presents with  . Headache    (Consider location/radiation/quality/duration/timing/severity/associated sxs/prior treatment) HPI Comments: Pt presents to the ED for generalized headache, dizziness, and weakness.  Sx present since 02/24/13 when she sustained a fall and head injury at work.  Pt was evaluated in the ED with negative head and neck imagining at that time.  Pt suffered post-concussive syndrome 2 days following accident with nausea, vomiting, and confusion- all of which have resolved at this time.  Pt has been referred to neurology but appt is still pending. No new head injury or trauma.  Headache associated with photophobia and mildly blurred vision.  Denies any associated phonophobia, aura, confusion, fevers sweats, or chills.  Dizziness present when lying still but increases when moving.  States sx have recently worsened over the past 2 weeks causing her to feel off balance when walking but she denies any actual falls.  Also endorses new onset left sided weakness, most prominent in her left arm causing her to drop normal items like a coffee cup.  Notes new onset tremor of left hand.  Denies any chest pain, SOB, palpitations, or AMS.  The history is provided by the patient.    Past Medical History  Diagnosis Date  . Arthritis     History reviewed. No pertinent past surgical history.  History reviewed. No pertinent family history.  History  Substance Use Topics  . Smoking status: Current Some Day Smoker    Start date: 07/21/2012  . Smokeless tobacco: Not on file  . Alcohol Use: No    OB History   Grav Para Term Preterm Abortions TAB SAB Ect Mult Living                  Review of Systems  HENT: Positive for neck pain.   Neurological: Positive for dizziness, tremors and light-headedness.  All other systems reviewed and  are negative.    Allergies  Prednisone  Home Medications   Current Outpatient Rx  Name  Route  Sig  Dispense  Refill  . acetaminophen (TYLENOL) 500 MG tablet   Oral   Take 500 mg by mouth every 6 (six) hours as needed for pain.         . traMADol (ULTRAM) 50 MG tablet   Oral   Take 50 mg by mouth every 6 (six) hours as needed for pain.           BP 195/89  Pulse 85  Temp(Src) 98.3 F (36.8 C) (Oral)  Resp 18  Ht 5\' 7"  (1.702 m)  Wt 180 lb (81.647 kg)  BMI 28.19 kg/m2  SpO2 95%  Physical Exam  Nursing note and vitals reviewed. Constitutional: She is oriented to person, place, and time. She appears well-developed and well-nourished. No distress.  HENT:  Head: Normocephalic and atraumatic.  Mouth/Throat: Oropharynx is clear and moist.  Eyes: Conjunctivae and EOM are normal. Pupils are equal, round, and reactive to light.  Neck: Normal range of motion. Neck supple. Muscular tenderness present. No rigidity.  No meningeal signs  Cardiovascular: Normal rate, regular rhythm and normal heart sounds.   Pulmonary/Chest: Effort normal and breath sounds normal. No respiratory distress.  Abdominal: Soft. Bowel sounds are normal. There is no tenderness. There is no guarding.  Musculoskeletal: Normal range of motion.  Neurological: She is  alert and oriented to person, place, and time. She displays tremor. No cranial nerve deficit or sensory deficit. She displays no seizure activity.  CN grossly intact, Left UE grip strength slightly decreased compared to right, equal LE strength, tremor of left hand  Skin: Skin is warm and dry. She is not diaphoretic.  Psychiatric: She has a normal mood and affect.    ED Course  Procedures (including critical care time)   Date: 03/11/2013  Rate: 69  Rhythm: normal sinus rhythm  QRS Axis: normal  Intervals: normal  ST/T Wave abnormalities: normal  Conduction Disutrbances:none  Narrative Interpretation: borderline LAD, no STEMI  Old EKG  Reviewed: changes noted    Labs Reviewed  BASIC METABOLIC PANEL - Abnormal; Notable for the following:    Glucose, Bld 107 (*)    Creatinine, Ser 0.48 (*)    All other components within normal limits  CBC WITH DIFFERENTIAL  POCT I-STAT TROPONIN I   Dg Chest 2 View  03/11/2013   *RADIOLOGY REPORT*  Clinical Data: Recent fall.  CHEST - 2 VIEW  Comparison: None.  Findings: The heart is normal in size.  The mediastinal and hilar contours are within normal limits.  There are chronic-appearing bronchitic type lung changes, possibly related to smoking.  There also bibasilar ill-defined airspace opacities which could reflect scarring or possible infiltrates.  No pleural effusion.  IMPRESSION:  1.  Probable underlying bronchitic changes related to smoking. 2.  Bibasilar airspace opacity could reflect scarring or infiltrates.   Original Report Authenticated By: Rudie Meyer, M.D.   Ct Head Wo Contrast  03/11/2013   *RADIOLOGY REPORT*  Clinical Data: Headache.  CT HEAD WITHOUT CONTRAST  Technique:  Contiguous axial images were obtained from the base of the skull through the vertex without contrast.  Comparison: 02/24/2013.  Findings: The ventricles are normal.  No extra-axial fluid collections are seen.  The brainstem and cerebellum are unremarkable.  No acute intracranial findings such as infarction or hemorrhage.  No mass lesions.  The bony calvarium is intact.  The visualized paranasal sinuses and mastoid air cells are clear.  IMPRESSION: Negative head CT.   Original Report Authenticated By: Rudie Meyer, M.D.     1. Headache   2. Dizziness   3. Neck pain       MDM    57 y.o. F presenting to the ED for headache, neck pain, and dizziness after a work-related fall on 02/24/13.  Work up negative at that time.  Sx increased over the past week.  Referred to neurology, appt still pending.  EKG NSR, no acute ischemic changes.  Trop neg.  CXR clear.  CT head negative.  Neck pain appears MSK in nature,  no fevers or nuchal rigidity to suggest meningitis.  Headache resolved with IVF, dilaudid, reglan.  Dizziness improved with meclizine.  Repeat neuro exam with improved strength of left UE, hand tremor still present.  Pt afebrile, non-toxic appearing, NAD, VS stable- ok for d/c.  FU with neurology once appt is available.  Rx meclizine and percocet.  Discussed plan with pt and daughter, they agreed.  Return precautions advised.  Discussed with Dr. Radford Pax who agrees with plan.   Garlon Hatchet, PA-C 03/11/13 1754

## 2013-03-11 NOTE — ED Notes (Signed)
Per pt family she has been having a headache, dizziness and fatigue since head injury May 9th. sts hasn't gotten better.

## 2013-03-12 NOTE — ED Provider Notes (Signed)
Medical screening examination/treatment/procedure(s) were performed by non-physician practitioner and as supervising physician I was immediately available for consultation/collaboration.    Nelia Shi, MD 03/12/13 1104

## 2013-06-08 IMAGING — CT CT CERVICAL SPINE W/O CM
5 of 6 series · 15 of 33 positions shown, 17 images · non-contrast
Comparison: Prior head CT and cervical spine CT 05/13/2004

CT HEAD

CLINICAL DATA: Fall, head injury, confusion

CT HEAD WITHOUT CONTRAST
CT CERVICAL SPINE WITHOUT CONTRAST
TECHNIQUE: Multidetector CT imaging of the head and cervical spine
was performed following the standard protocol without intravenous
contrast.  Multiplanar CT image reconstructions of the cervical
spine were also generated.

[Series 5: head trauma 2.4 h60s bone · axial · 0.43mm/px · z∈[-83,-34]mm · 2 of 60 slices shown]
[im 20/60  bone]
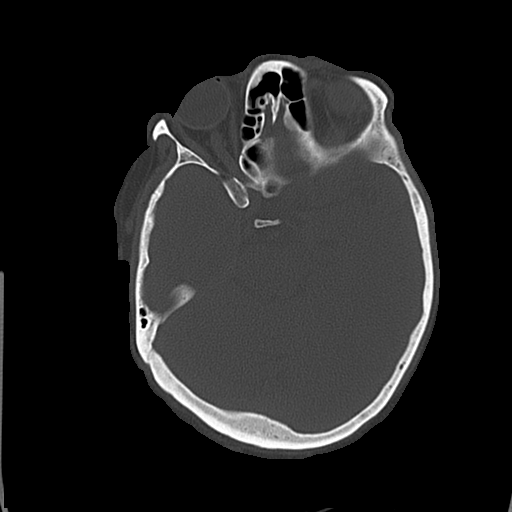
[im 40/60  bone]
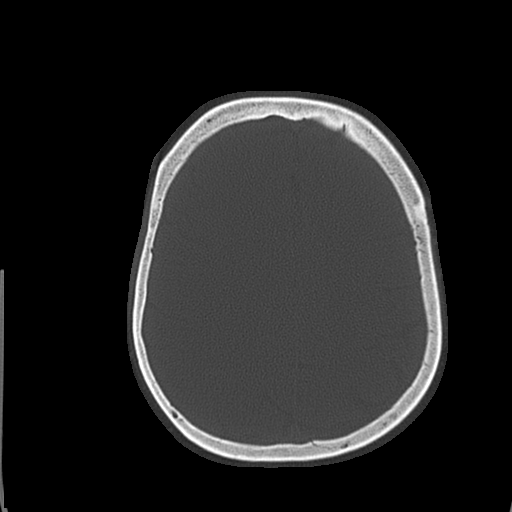

[Series 7: c_spine 2.0 b31s detail · axial · 0.28mm/px · z∈[-246,-170]mm · 3 of 78 slices shown, 4 images]
[im 20/78  soft-tissue]
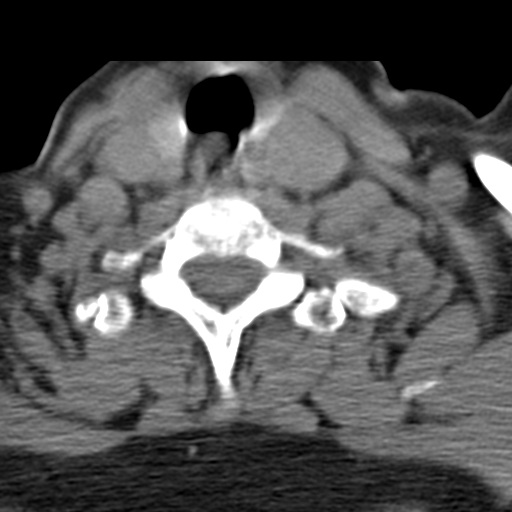
[im 20/78  bone]
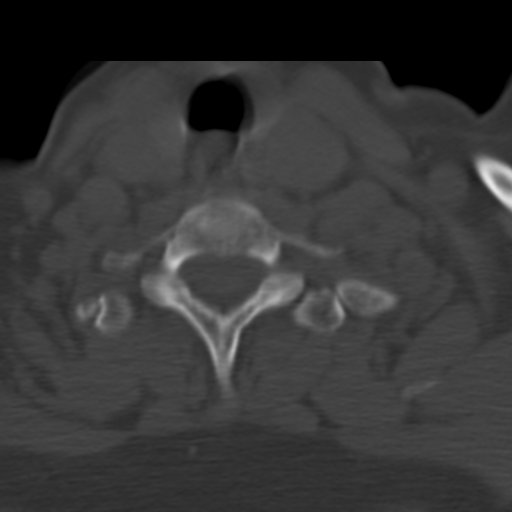
[im 39/78  bone]
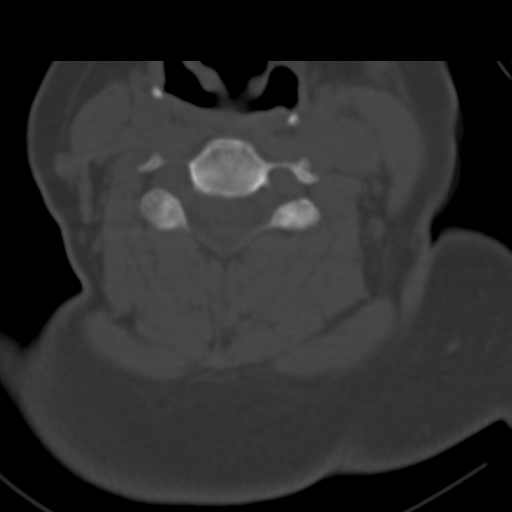
[im 58/78  bone]
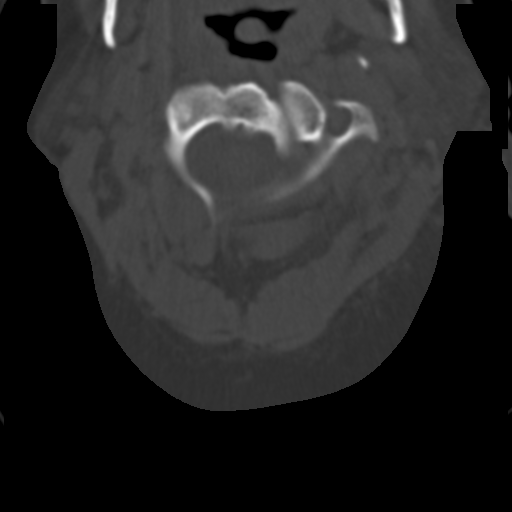

[Series 9: coronals · coronal · 0.36mm/px · 3 of 63 slices shown]
[im 13/63  bone]
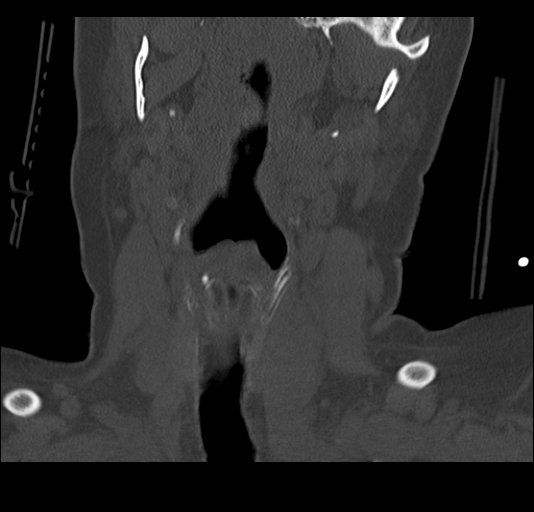
[im 25/63  bone]
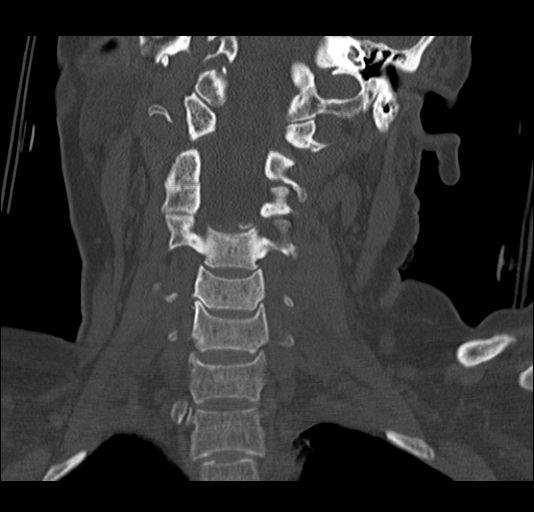
[im 38/63  bone]
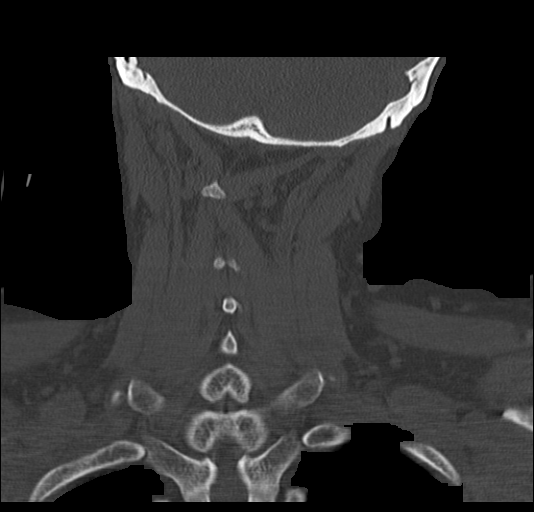

[Series 10: sagittals · sagittal · 0.30mm/px · 5 of 58 slices shown, 6 images]
[im 20/58  bone]
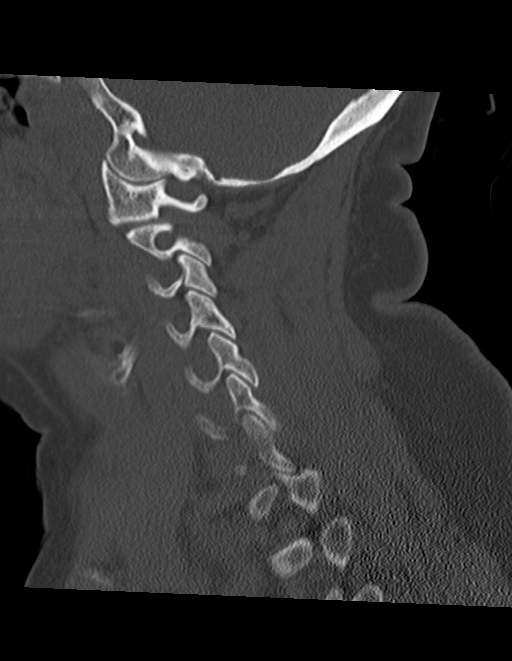
[im 24/58  bone]
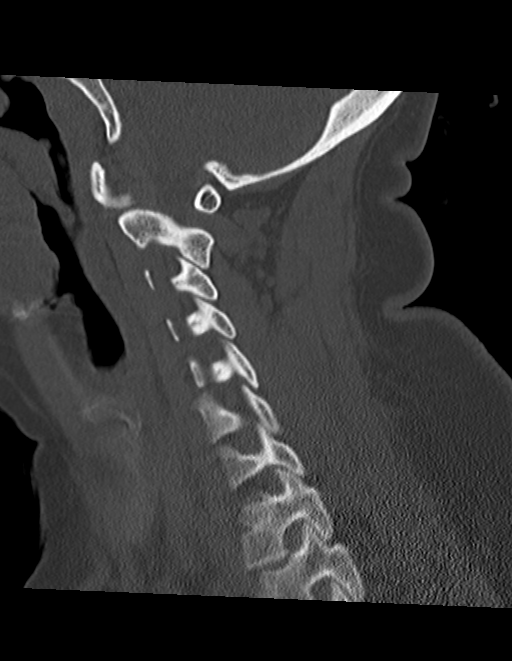
[im 29/58  soft-tissue]
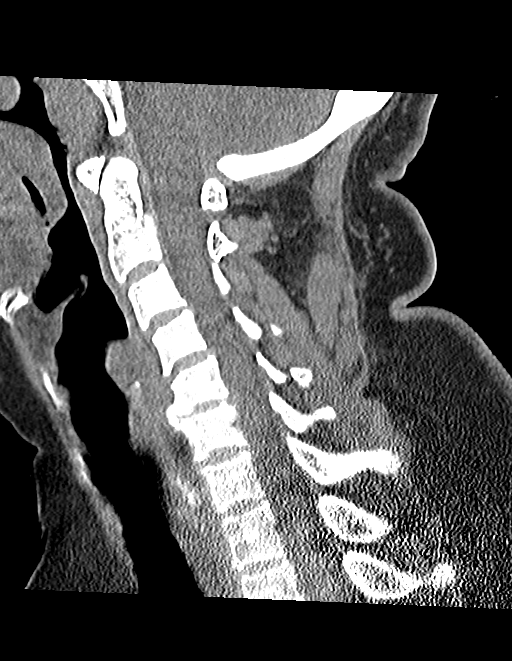
[im 29/58  bone]
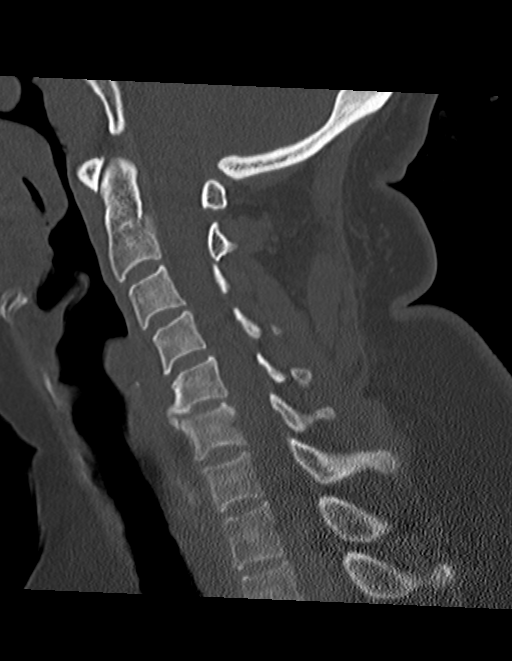
[im 34/58  bone]
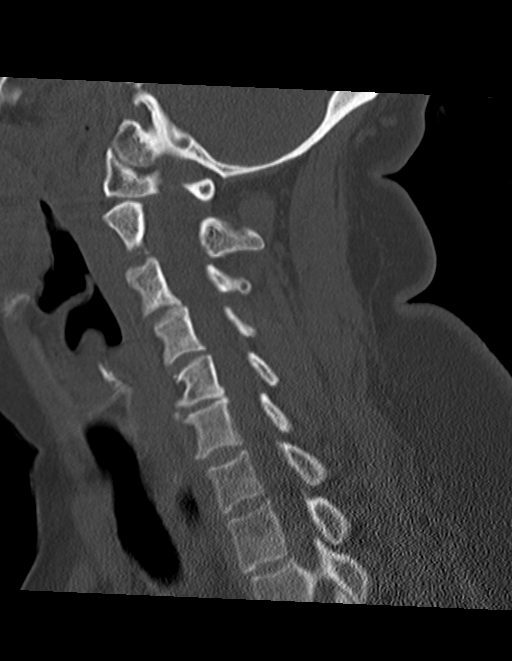
[im 39/58  bone]
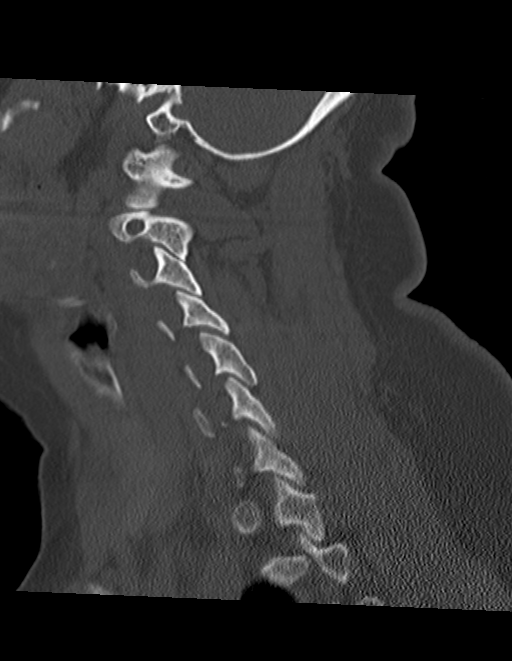

[Series 11: orthogonals · axial · 0.24mm/px · z∈[-252,-211]mm · 2 of 68 slices shown]
[im 23/68  bone]
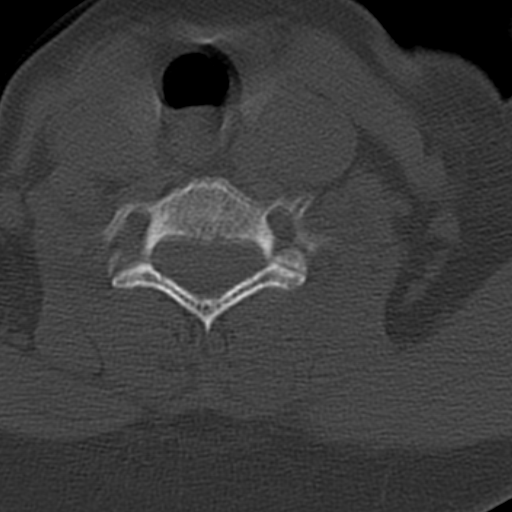
[im 45/68  bone]
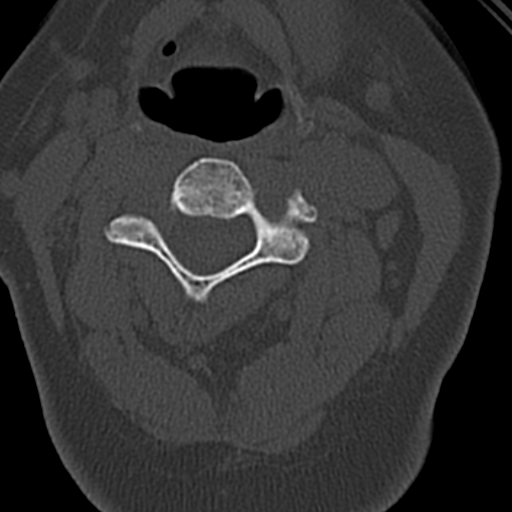

[15 of 33 positions shown; findings below may reference images not displayed]

FINDINGS: No acute intracranial hemorrhage, acute infarction, mass
lesion, mass effect, midline shift or hydrocephalus.  Gray-white
differentiation is preserved throughout.  No focal soft tissue
swelling or scalp hematoma.  No acute calvarial abnormality.  The
globes and orbits are unremarkable.  Normal aeration of the mastoid
air cells and maxillary sinuses.
IMPRESSION: Negative head CT

CT CERVICAL SPINE
FINDINGS: No acute fracture, malalignment or prevertebral soft
tissue swelling.  Mild asymmetry of the lateral atlantodental
interval likely related to rotation of C1 on C2.  The absence of
clinical torticollis, this is almost certainly positional.  Mild
paraseptal emphysema in the left lung apex.  No acute soft tissue
abnormality.  There is straightening of the normal cervical
lordosis which is similar in appearance compared to the prior
study.  Focal cervical spondylosis at C5-C6 without significant
interval progression compared to prior.
IMPRESSION: 1.  No acute fracture or malalignment

2.  Cervical spondylosis at C5-C6 with straightening of the normal
cervical lordosis is similar compared to 05/13/2004.

## 2019-11-28 DIAGNOSIS — F4310 Post-traumatic stress disorder, unspecified: Secondary | ICD-10-CM | POA: Diagnosis not present

## 2019-11-28 DIAGNOSIS — F411 Generalized anxiety disorder: Secondary | ICD-10-CM | POA: Diagnosis not present

## 2019-11-28 DIAGNOSIS — F321 Major depressive disorder, single episode, moderate: Secondary | ICD-10-CM | POA: Diagnosis not present

## 2020-07-23 ENCOUNTER — Encounter: Payer: PRIVATE HEALTH INSURANCE | Admitting: Emergency Medicine

## 2020-10-14 ENCOUNTER — Other Ambulatory Visit: Payer: Self-pay

## 2020-10-14 ENCOUNTER — Ambulatory Visit (INDEPENDENT_AMBULATORY_CARE_PROVIDER_SITE_OTHER): Payer: PPO | Admitting: Emergency Medicine

## 2020-10-14 ENCOUNTER — Encounter: Payer: Self-pay | Admitting: Emergency Medicine

## 2020-10-14 VITALS — BP 170/78 | HR 79 | Temp 98.8°F | Resp 16 | Ht 64.5 in | Wt 172.0 lb

## 2020-10-14 DIAGNOSIS — Z7689 Persons encountering health services in other specified circumstances: Secondary | ICD-10-CM

## 2020-10-14 DIAGNOSIS — I10 Essential (primary) hypertension: Secondary | ICD-10-CM

## 2020-10-14 DIAGNOSIS — Z23 Encounter for immunization: Secondary | ICD-10-CM

## 2020-10-14 MED ORDER — LOSARTAN POTASSIUM 50 MG PO TABS: 50.0000 mg | ORAL_TABLET | Freq: Every day | ORAL | 3 refills | Status: DC

## 2020-10-14 NOTE — Progress Notes (Signed)
Anita Young 64 y.o.   Chief Complaint  Patient presents with  . Establish Care    Per patient she had high blood pressure before    HISTORY OF PRESENT ILLNESS: This is a 64 y.o. female here to establish care with me. Has history of hypertension, on no medication.  Wants to get started on medication. Review of medical records show psychiatric diagnosis: Major depressive disorder, generalized anxiety disorder, and PTSD. Last psychiatric visit February 2021 shows she is on Wellbutrin, clonazepam, and Lexapro.  However patient states she is not taking any medications. Has no other complaints or medical concerns today. Fully vaccinated against Covid. Chronic smoker.  HPI   Prior to Admission medications   Medication Sig Start Date End Date Taking? Authorizing Provider  aspirin EC 81 MG tablet Take 81 mg by mouth daily. Swallow whole.   Yes [provider]  acetaminophen (TYLENOL) 500 MG tablet Take 500 mg by mouth every 6 (six) hours as needed for pain. Patient not taking: Reported on 10/14/2020    [provider]  meclizine (ANTIVERT) 25 MG tablet Take 1 tablet (25 mg total) by mouth 3 (three) times daily as needed. Patient not taking: Reported on 10/14/2020 03/11/13   Garlon Hatchet, PA-C  oxyCODONE-acetaminophen (PERCOCET/ROXICET) 5-325 MG per tablet Take 1 tablet by mouth every 4 (four) hours as needed for pain. Patient not taking: Reported on 10/14/2020 03/11/13   Garlon Hatchet, PA-C  traMADol (ULTRAM) 50 MG tablet Take 50 mg by mouth every 6 (six) hours as needed for pain. Patient not taking: Reported on 10/14/2020    [provider]    Allergies  Allergen Reactions  . Prednisone Other (See Comments)    Headache redness    Patient Active Problem List   Diagnosis Date Noted  . Polyarthralgia 06/07/2012  . 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 Arthritis     History reviewed. No pertinent  surgical history.  Social History   Socioeconomic History  . Marital status: Married    Spouse name: Not on file  . Number of children: Not on file  . Years of education: Not on file  . Highest education level: Not on file  Occupational History  . Not on file  Tobacco Use  . Smoking status: Current Some Day Smoker    Start date: 07/21/2012  . Smokeless tobacco: Never Used  Substance and Sexual Activity  . Alcohol use: No  . Drug use: Not on file  . Sexual activity: Never    Birth control/protection: Abstinence  Other Topics Concern  . Not on file  Social History Narrative  . Not on file   Social Determinants of Health   Financial Resource Strain: Not on file  Food Insecurity: Not on file  Transportation Needs: Not on file  Physical Activity: Not on file  Stress: Not on file  Social Connections: Not on file  Intimate Partner Violence: Not on file    History reviewed. No pertinent family history.   Review of Systems  Constitutional: Negative.  Negative for chills and fever.  HENT: Negative.  Negative for congestion and sore throat.   Respiratory: Negative.  Negative for cough and shortness of breath.   Cardiovascular: Negative.  Negative for chest pain and palpitations.  Gastrointestinal: Negative.  Negative for abdominal pain, diarrhea, nausea and vomiting.  Genitourinary: Negative.  Negative for dysuria and hematuria.  Skin: Negative.  Negative for rash.  Neurological: Negative.  Negative for dizziness and headaches.  All other systems reviewed and are negative.    Today's Vitals   10/14/20 1327  BP: (!) 184/74  Pulse: 79  Resp: 16  Temp: 98.8 F (37.1 C)  TempSrc: Temporal  SpO2: 98%  Weight: 172 lb (78 kg)  Height: 5' 4.5" (1.638 m)   Body mass index is 29.07 kg/m.   Physical Exam Vitals reviewed.  Constitutional:      Appearance: Normal appearance.  HENT:     Head: Normocephalic.  Eyes:     Extraocular Movements: Extraocular movements  intact.     Pupils: Pupils are equal, round, and reactive to light.  Cardiovascular:     Rate and Rhythm: Normal rate and regular rhythm.     Pulses: Normal pulses.     Heart sounds: Normal heart sounds.  Pulmonary:     Effort: Pulmonary effort is normal.     Breath sounds: Normal breath sounds.  Musculoskeletal:        General: Normal range of motion.     Cervical back: Normal range of motion and neck supple.  Skin:    General: Skin is warm and dry.     Capillary Refill: Capillary refill takes less than 2 seconds.  Neurological:     General: No focal deficit present.     Mental Status: She is alert and oriented to person, place, and time.  Psychiatric:        Mood and Affect: Mood normal.        Behavior: Behavior normal.      ASSESSMENT & PLAN: Aila was seen today for establish care.  Diagnoses and all orders for this visit:  Essential hypertension -     losartan (COZAAR) 50 MG tablet; Take 1 tablet (50 mg total) by mouth daily.  Need for vaccination with 13-polyvalent pneumococcal conjugate vaccine -     Pneumococcal conjugate vaccine 13-valent IM  Encounter to establish care    Patient Instructions       If you have lab work done today you will be contacted with your lab results within the next 2 weeks.  If you have not heard from [place] then please contact us. The fastest way to get your results is to register for My Chart.   IF you received an x-ray today, you will receive an invoice from [facility]. Please contact [facility] at [phone] with questions or concerns regarding your invoice.   IF you received labwork today, you will receive an invoice from [facility]. Please contact [facility] at [phone] with questions or concerns regarding your invoice.   Our billing staff will not be able to assist you with questions regarding bills from these companies.  You will be contacted with the lab results as soon as they are available. The  fastest way to get your results is to activate your My Chart account. Instructions are located on the last page of this paperwork. If you have not heard from [place] regarding the results in 2 weeks, please contact this office.     Hypertension, Adult High blood pressure (hypertension) is when the force of blood pumping through the arteries is too strong. The arteries are the blood vessels that carry blood from the heart throughout the body. Hypertension forces the heart to work harder to pump blood and may cause arteries to become narrow or stiff. Untreated or uncontrolled hypertension can cause a heart attack, heart failure, a stroke, kidney disease, and other problems. A blood pressure reading  consists of a higher number over a lower number. Ideally, your blood pressure should be below 120/80. The first ("top") number is called the systolic pressure. It is a measure of the pressure in your arteries as your heart beats. The second ("bottom") number is called the diastolic pressure. It is a measure of the pressure in your arteries as the heart relaxes. What are the causes? The exact cause of this condition is not known. There are some conditions that result in or are related to high blood pressure. What increases the risk? Some risk factors for high blood pressure are under your control. The following factors may make you more likely to develop this condition:  Smoking.  Having type 2 diabetes mellitus, high cholesterol, or both.  Not getting enough exercise or physical activity.  Being overweight.  Having too much fat, sugar, calories, or salt (sodium) in your diet.  Drinking too much alcohol. Some risk factors for high blood pressure may be difficult or impossible to change. Some of these factors include:  Having chronic kidney disease.  Having a family history of high blood pressure.  Age. Risk increases with age.  Race. You may be at higher risk if you are African American.  Gender.  Men are at higher risk than women before age 66. After age 15, women are at higher risk than men.  Having obstructive sleep apnea.  Stress. What are the signs or symptoms? High blood pressure may not cause symptoms. Very high blood pressure (hypertensive crisis) may cause:  Headache.  Anxiety.  Shortness of breath.  Nosebleed.  Nausea and vomiting.  Vision changes.  Severe chest pain.  Seizures. How is this diagnosed? This condition is diagnosed by measuring your blood pressure while you are seated, with your arm resting on a flat surface, your legs uncrossed, and your feet flat on the floor. The cuff of the blood pressure monitor will be placed directly against the skin of your upper arm at the level of your heart. It should be measured at least twice using the same arm. Certain conditions can cause a difference in blood pressure between your right and left arms. Certain factors can cause blood pressure readings to be lower or higher than normal for a short period of time:  When your blood pressure is higher when you are in a health care provider's office than when you are at home, this is called white coat hypertension. Most people with this condition do not need medicines.  When your blood pressure is higher at home than when you are in a health care provider's office, this is called masked hypertension. Most people with this condition may need medicines to control blood pressure. If you have a high blood pressure reading during one visit or you have normal blood pressure with other risk factors, you may be asked to:  Return on a different day to have your blood pressure checked again.  Monitor your blood pressure at home for 1 week or longer. If you are diagnosed with hypertension, you may have other blood or imaging tests to help your health care provider understand your overall risk for other conditions. How is this treated? This condition is treated by making healthy  lifestyle changes, such as eating healthy foods, exercising more, and reducing your alcohol intake. Your health care provider may prescribe medicine if lifestyle changes are not enough to get your blood pressure under control, and if:  Your systolic blood pressure is above 130.  Your diastolic blood  pressure is above 80. Your personal target blood pressure may vary depending on your medical conditions, your age, and other factors. Follow these instructions at home: Eating and drinking   Eat a diet that is high in fiber and potassium, and low in sodium, added sugar, and fat. An example eating plan is called the DASH (Dietary Approaches to Stop Hypertension) diet. To eat this way: ? Eat plenty of fresh fruits and vegetables. Try to fill one half of your plate at each meal with fruits and vegetables. ? Eat whole grains, such as whole-wheat pasta, brown rice, or whole-grain bread. Fill about one fourth of your plate with whole grains. ? Eat or drink low-fat dairy products, such as skim milk or low-fat yogurt. ? Avoid fatty cuts of meat, processed or cured meats, and poultry with skin. Fill about one fourth of your plate with lean proteins, such as fish, chicken without skin, beans, eggs, or tofu. ? Avoid pre-made and processed foods. These tend to be higher in sodium, added sugar, and fat.  Reduce your daily sodium intake. Most people with hypertension should eat less than 1,500 mg of sodium a day.  Do not drink alcohol if: ? Your health care provider tells you not to drink. ? You are pregnant, may be pregnant, or are planning to become pregnant.  If you drink alcohol: ? Limit how much you use to:  0-1 drink a day for women.  0-2 drinks a day for men. ? Be aware of how much alcohol is in your drink. In the [place] one drink equals one 12 oz bottle of beer (355 mL), one 5 oz glass of wine (148 mL), or one 1 oz glass of hard liquor (44 mL). Lifestyle   Work with your health care provider to  maintain a healthy body weight or to lose weight. Ask what an ideal weight is for you.  Get at least 30 minutes of exercise most days of the week. Activities may include walking, swimming, or biking.  Include exercise to strengthen your muscles (resistance exercise), such as Pilates or lifting weights, as part of your weekly exercise routine. Try to do these types of exercises for 30 minutes at least 3 days a week.  Do not use any products that contain nicotine or tobacco, such as cigarettes, e-cigarettes, and chewing tobacco. If you need help quitting, ask your health care provider.  Monitor your blood pressure at home as told by your health care provider.  Keep all follow-up visits as told by your health care provider. This is important. Medicines  Take over-the-counter and prescription medicines only as told by your health care provider. Follow directions carefully. Blood pressure medicines must be taken as prescribed.  Do not skip doses of blood pressure medicine. Doing this puts you at risk for problems and can make the medicine less effective.  Ask your health care provider about side effects or reactions to medicines that you should watch for. Contact a health care provider if you:  Think you are having a reaction to a medicine you are taking.  Have headaches that keep coming back (recurring).  Feel dizzy.  Have swelling in your ankles.  Have trouble with your vision. Get help right away if you:  Develop a severe headache or confusion.  Have unusual weakness or numbness.  Feel faint.  Have severe pain in your chest or abdomen.  Vomit repeatedly.  Have trouble breathing. Summary  Hypertension is when the force of blood pumping through your  arteries is too strong. If this condition is not controlled, it may put you at risk for serious complications.  Your personal target blood pressure may vary depending on your medical conditions, your age, and other factors. For  most people, a normal blood pressure is less than 120/80.  Hypertension is treated with lifestyle changes, medicines, or a combination of both. Lifestyle changes include losing weight, eating a healthy, low-sodium diet, exercising more, and limiting alcohol. This information is not intended to replace advice given to you by your health care provider. Make sure you discuss any questions you have with your health care provider. Document Revised: 06/15/2018 Document Reviewed: 06/15/2018 Elsevier Patient Education  2020 [employer]      Edwina BarthMiguel Dewey Neukam, MD [facility]

## 2020-10-14 NOTE — Patient Instructions (Addendum)
   If you have lab work done today you will be contacted with your lab results within the next 2 weeks.  If you have not heard from us then please contact us. The fastest way to get your results is to register for My Chart.   IF you received an x-ray today, you will receive an invoice from [facility]. Please contact [facility] at [phone] with questions or concerns regarding your invoice.   IF you received labwork today, you will receive an invoice from [facility]. Please contact [facility] at [phone] with questions or concerns regarding your invoice.   Our billing staff will not be able to assist you with questions regarding bills from these companies.  You will be contacted with the lab results as soon as they are available. The fastest way to get your results is to activate your My Chart account. Instructions are located on the last page of this paperwork. If you have not heard from us regarding the results in 2 weeks, please contact this office.      Hypertension, Adult High blood pressure (hypertension) is when the force of blood pumping through the arteries is too strong. The arteries are the blood vessels that carry blood from the heart throughout the body. Hypertension forces the heart to work harder to pump blood and may cause arteries to become narrow or stiff. Untreated or uncontrolled hypertension can cause a heart attack, heart failure, a stroke, kidney disease, and other problems. A blood pressure reading consists of a higher number over a lower number. Ideally, your blood pressure should be below 120/80. The first ("top") number is called the systolic pressure. It is a measure of the pressure in your arteries as your heart beats. The second ("bottom") number is called the diastolic pressure. It is a measure of the pressure in your arteries as the heart relaxes. What are the causes? The exact cause of this condition is not known. There are some conditions  that result in or are related to high blood pressure. What increases the risk? Some risk factors for high blood pressure are under your control. The following factors may make you more likely to develop this condition:  Smoking.  Having type 2 diabetes mellitus, high cholesterol, or both.  Not getting enough exercise or physical activity.  Being overweight.  Having too much fat, sugar, calories, or salt (sodium) in your diet.  Drinking too much alcohol. Some risk factors for high blood pressure may be difficult or impossible to change. Some of these factors include:  Having chronic kidney disease.  Having a family history of high blood pressure.  Age. Risk increases with age.  Race. You may be at higher risk if you are African American.  Gender. Men are at higher risk than women before age 45. After age 65, women are at higher risk than men.  Having obstructive sleep apnea.  Stress. What are the signs or symptoms? High blood pressure may not cause symptoms. Very high blood pressure (hypertensive crisis) may cause:  Headache.  Anxiety.  Shortness of breath.  Nosebleed.  Nausea and vomiting.  Vision changes.  Severe chest pain.  Seizures. How is this diagnosed? This condition is diagnosed by measuring your blood pressure while you are seated, with your arm resting on a flat surface, your legs uncrossed, and your feet flat on the floor. The cuff of the blood pressure monitor will be placed directly against the skin of your upper arm at the level of your   heart. It should be measured at least twice using the same arm. Certain conditions can cause a difference in blood pressure between your right and left arms. Certain factors can cause blood pressure readings to be lower or higher than normal for a short period of time:  When your blood pressure is higher when you are in a health care provider's office than when you are at home, this is called white coat hypertension.  Most people with this condition do not need medicines.  When your blood pressure is higher at home than when you are in a health care provider's office, this is called masked hypertension. Most people with this condition may need medicines to control blood pressure. If you have a high blood pressure reading during one visit or you have normal blood pressure with other risk factors, you may be asked to:  Return on a different day to have your blood pressure checked again.  Monitor your blood pressure at home for 1 week or longer. If you are diagnosed with hypertension, you may have other blood or imaging tests to help your health care provider understand your overall risk for other conditions. How is this treated? This condition is treated by making healthy lifestyle changes, such as eating healthy foods, exercising more, and reducing your alcohol intake. Your health care provider may prescribe medicine if lifestyle changes are not enough to get your blood pressure under control, and if:  Your systolic blood pressure is above 130.  Your diastolic blood pressure is above 80. Your personal target blood pressure may vary depending on your medical conditions, your age, and other factors. Follow these instructions at home: Eating and drinking   Eat a diet that is high in fiber and potassium, and low in sodium, added sugar, and fat. An example eating plan is called the DASH (Dietary Approaches to Stop Hypertension) diet. To eat this way: ? Eat plenty of fresh fruits and vegetables. Try to fill one half of your plate at each meal with fruits and vegetables. ? Eat whole grains, such as whole-wheat pasta, brown rice, or whole-grain bread. Fill about one fourth of your plate with whole grains. ? Eat or drink low-fat dairy products, such as skim milk or low-fat yogurt. ? Avoid fatty cuts of meat, processed or cured meats, and poultry with skin. Fill about one fourth of your plate with lean proteins, such  as fish, chicken without skin, beans, eggs, or tofu. ? Avoid pre-made and processed foods. These tend to be higher in sodium, added sugar, and fat.  Reduce your daily sodium intake. Most people with hypertension should eat less than 1,500 mg of sodium a day.  Do not drink alcohol if: ? Your health care provider tells you not to drink. ? You are pregnant, may be pregnant, or are planning to become pregnant.  If you drink alcohol: ? Limit how much you use to:  0-1 drink a day for women.  0-2 drinks a day for men. ? Be aware of how much alcohol is in your drink. In the [place] one drink equals one 12 oz bottle of beer (355 mL), one 5 oz glass of wine (148 mL), or one 1 oz glass of hard liquor (44 mL). Lifestyle   Work with your health care provider to maintain a healthy body weight or to lose weight. Ask what an ideal weight is for you.  Get at least 30 minutes of exercise most days of the week. Activities may include walking, swimming,   or biking.  Include exercise to strengthen your muscles (resistance exercise), such as Pilates or lifting weights, as part of your weekly exercise routine. Try to do these types of exercises for 30 minutes at least 3 days a week.  Do not use any products that contain nicotine or tobacco, such as cigarettes, e-cigarettes, and chewing tobacco. If you need help quitting, ask your health care provider.  Monitor your blood pressure at home as told by your health care provider.  Keep all follow-up visits as told by your health care provider. This is important. Medicines  Take over-the-counter and prescription medicines only as told by your health care provider. Follow directions carefully. Blood pressure medicines must be taken as prescribed.  Do not skip doses of blood pressure medicine. Doing this puts you at risk for problems and can make the medicine less effective.  Ask your health care provider about side effects or reactions to medicines that you  should watch for. Contact a health care provider if you:  Think you are having a reaction to a medicine you are taking.  Have headaches that keep coming back (recurring).  Feel dizzy.  Have swelling in your ankles.  Have trouble with your vision. Get help right away if you:  Develop a severe headache or confusion.  Have unusual weakness or numbness.  Feel faint.  Have severe pain in your chest or abdomen.  Vomit repeatedly.  Have trouble breathing. Summary  Hypertension is when the force of blood pumping through your arteries is too strong. If this condition is not controlled, it may put you at risk for serious complications.  Your personal target blood pressure may vary depending on your medical conditions, your age, and other factors. For most people, a normal blood pressure is less than 120/80.  Hypertension is treated with lifestyle changes, medicines, or a combination of both. Lifestyle changes include losing weight, eating a healthy, low-sodium diet, exercising more, and limiting alcohol. This information is not intended to replace advice given to you by your health care provider. Make sure you discuss any questions you have with your health care provider. Document Revised: 06/15/2018 Document Reviewed: 06/15/2018 Elsevier Patient Education  2020 Elsevier Inc.  

## 2020-12-02 DIAGNOSIS — R413 Other amnesia: Secondary | ICD-10-CM | POA: Diagnosis not present

## 2021-04-14 ENCOUNTER — Other Ambulatory Visit: Payer: Self-pay

## 2021-04-14 ENCOUNTER — Encounter: Payer: Self-pay | Admitting: Emergency Medicine

## 2021-04-14 ENCOUNTER — Ambulatory Visit (INDEPENDENT_AMBULATORY_CARE_PROVIDER_SITE_OTHER): Payer: PRIVATE HEALTH INSURANCE | Admitting: Emergency Medicine

## 2021-04-14 DIAGNOSIS — I10 Essential (primary) hypertension: Secondary | ICD-10-CM

## 2021-04-14 LAB — CBC WITH DIFFERENTIAL/PLATELET
Basophils Absolute: 0.1 10*3/uL (ref 0.0–0.1)
Basophils Relative: 0.8 % (ref 0.0–3.0)
Eosinophils Absolute: 0.2 10*3/uL (ref 0.0–0.7)
Eosinophils Relative: 2.1 % (ref 0.0–5.0)
HCT: 38.6 % (ref 36.0–46.0)
Hemoglobin: 13 g/dL (ref 12.0–15.0)
Lymphocytes Relative: 19.7 % (ref 12.0–46.0)
Lymphs Abs: 1.7 10*3/uL (ref 0.7–4.0)
MCHC: 33.8 g/dL (ref 30.0–36.0)
MCV: 90.9 fl (ref 78.0–100.0)
Monocytes Absolute: 0.6 10*3/uL (ref 0.1–1.0)
Monocytes Relative: 7.2 % (ref 3.0–12.0)
Neutro Abs: 5.9 10*3/uL (ref 1.4–7.7)
Neutrophils Relative %: 70.2 % (ref 43.0–77.0)
Platelets: 218 10*3/uL (ref 150.0–400.0)
RBC: 4.25 Mil/uL (ref 3.87–5.11)
RDW: 13.6 % (ref 11.5–15.5)
WBC: 8.4 10*3/uL (ref 4.0–10.5)

## 2021-04-14 LAB — TSH: TSH: 4.39 u[IU]/mL (ref 0.35–4.50)

## 2021-04-14 LAB — LIPID PANEL
Cholesterol: 195 mg/dL (ref 0–200)
HDL: 44.3 mg/dL (ref >39.00)
LDL Cholesterol: 129 mg/dL — ABNORMAL HIGH (ref 0–99)
NonHDL: 151.13
Total CHOL/HDL Ratio: 4
Triglycerides: 111 mg/dL (ref 0.0–149.0)
VLDL: 22.2 mg/dL (ref 0.0–40.0)

## 2021-04-14 LAB — COMPREHENSIVE METABOLIC PANEL
ALT: 10 U/L (ref 0–35)
AST: 16 U/L (ref 0–37)
Albumin: 3.9 g/dL (ref 3.5–5.2)
Alkaline Phosphatase: 117 U/L (ref 39–117)
BUN: 18 mg/dL (ref 6–23)
CO2: 27 mEq/L (ref 19–32)
Calcium: 9.5 mg/dL (ref 8.4–10.5)
Chloride: 105 mEq/L (ref 96–112)
Creatinine, Ser: 0.66 mg/dL (ref 0.40–1.20)
GFR: 91.15 mL/min (ref >60.00)
Glucose, Bld: 87 mg/dL (ref 70–99)
Potassium: 4.4 mEq/L (ref 3.5–5.1)
Sodium: 140 mEq/L (ref 135–145)
Total Bilirubin: 0.4 mg/dL (ref 0.2–1.2)
Total Protein: 7.5 g/dL (ref 6.0–8.3)

## 2021-04-14 LAB — HEMOGLOBIN A1C: Hgb A1c MFr Bld: 6.2 % (ref 4.6–6.5)

## 2021-04-14 MED ORDER — LOSARTAN POTASSIUM-HCTZ 100-12.5 MG PO TABS: 1.0000 | ORAL_TABLET | Freq: Every day | ORAL | 3 refills | Status: DC

## 2021-04-14 NOTE — Assessment & Plan Note (Signed)
Elevated blood pressure readings at home and in the office. Will increase losartan to losartan-HCTZ 100-12.5 mg. Dietary approach to stop hypertension discussed. Advised to continue monitoring blood pressure readings at home and keep a log. Follow-up in 3 months.

## 2021-04-14 NOTE — Patient Instructions (Signed)

## 2021-04-14 NOTE — Progress Notes (Signed)
Anita Young 65 y.o.   Chief Complaint  Patient presents with   Hypertension    Follow 6 months   Medication Refill    Losartan    HISTORY OF PRESENT ILLNESS: This is a 65 y.o. female with history of hypertension here for follow-up and medication refill. Presently taking losartan 50 mg daily. States blood pressure readings at home have been elevated. No other complaints or medical concerns today.  Hypertension Pertinent negatives include no chest pain, headaches, palpitations or shortness of breath.  Medication Refill Pertinent negatives include no abdominal pain, chest pain, chills, congestion, coughing, fever, headaches, nausea, rash, sore throat or vomiting.    Prior to Admission medications   Medication Sig Start Date End Date Taking? Authorizing Provider  Apoaequorin (PREVAGEN PO) Take by mouth daily.   Yes [provider]  aspirin EC 81 MG tablet Take 81 mg by mouth daily. Swallow whole.   Yes [provider]  losartan-hydrochlorothiazide (HYZAAR) 100-12.5 MG tablet Take 1 tablet by mouth daily. 04/14/21  Yes Esraa Seres, Eilleen Kempf, MD  acetaminophen (TYLENOL) 500 MG tablet Take 500 mg by mouth every 6 (six) hours as needed for pain. Patient not taking: No sig reported    [provider]  meclizine (ANTIVERT) 25 MG tablet Take 1 tablet (25 mg total) by mouth 3 (three) times daily as needed. Patient not taking: No sig reported 03/11/13   Garlon Hatchet, PA-C  oxyCODONE-acetaminophen (PERCOCET/ROXICET) 5-325 MG per tablet Take 1 tablet by mouth every 4 (four) hours as needed for pain. Patient not taking: No sig reported 03/11/13   Garlon Hatchet, PA-C  traMADol (ULTRAM) 50 MG tablet Take 50 mg by mouth every 6 (six) hours as needed for pain. Patient not taking: No sig reported    [provider]    Allergies  Allergen Reactions   Prednisone Other (See Comments)    Headache redness    Patient Active Problem List   Diagnosis Date  Noted   Essential hypertension 04/14/2021   Polyarthralgia 06/07/2012   Rheumatoid factor positive 06/07/2012    Past Medical History:  Diagnosis Date   Arthritis     No past surgical history on file.  Social History   Socioeconomic History   Marital status: Married    Spouse name: Not on file   Number of children: Not on file   Years of education: Not on file   Highest education level: Not on file  Occupational History   Not on file  Tobacco Use   Smoking status: Some Days    Pack years: 0.00    Types: Cigarettes    Start date: 07/21/2012   Smokeless tobacco: Never  Substance and Sexual Activity   Alcohol use: No   Drug use: Not on file   Sexual activity: Never    Birth control/protection: Abstinence  Other Topics Concern   Not on file  Social History Narrative   Not on file   Social Determinants of Health   Financial Resource Strain: Not on file  Food Insecurity: Not on file  Transportation Needs: Not on file  Physical Activity: Not on file  Stress: Not on file  Social Connections: Not on file  Intimate Partner Violence: Not on file    No family history on file.   Review of Systems  Constitutional: Negative.  Negative for chills and fever.  HENT: Negative.  Negative for congestion and sore throat.   Respiratory: Negative.  Negative for cough and shortness of breath.  Cardiovascular: Negative.  Negative for chest pain and palpitations.  Gastrointestinal:  Negative for abdominal pain, diarrhea, nausea and vomiting.  Genitourinary: Negative.  Negative for dysuria and hematuria.  Skin: Negative.  Negative for rash.  Neurological: Negative.  Negative for dizziness and headaches.  All other systems reviewed and are negative.  Today's Vitals   04/14/21 1358  BP: (!) 160/90  Pulse: 68  Temp: 98.7 F (37.1 C)  TempSrc: Oral  SpO2: 97%  Weight: 169 lb 12.8 oz (77 kg)  Height: 5' 4.5" (1.638 m)   Body mass index is 28.7 kg/m.  Physical Exam Vitals  reviewed.  Constitutional:      Appearance: Normal appearance.  HENT:     Head: Normocephalic.  Eyes:     Extraocular Movements: Extraocular movements intact.     Conjunctiva/sclera: Conjunctivae normal.     Pupils: Pupils are equal, round, and reactive to light.  Cardiovascular:     Rate and Rhythm: Normal rate and regular rhythm.     Pulses: Normal pulses.     Heart sounds: Normal heart sounds.  Pulmonary:     Effort: Pulmonary effort is normal.     Breath sounds: Normal breath sounds.  Musculoskeletal:        General: Normal range of motion.     Cervical back: Normal range of motion and neck supple.  Skin:    General: Skin is warm and dry.     Capillary Refill: Capillary refill takes less than 2 seconds.  Neurological:     General: No focal deficit present.     Mental Status: She is alert and oriented to person, place, and time.  Psychiatric:        Mood and Affect: Mood normal.        Behavior: Behavior normal.     ASSESSMENT & PLAN: A total of 30 minutes was spent with the patient and counseling/coordination of care regarding hypertension and cardiovascular risks associated with this condition, review of all medications and need to adjust antihypertensive dose, review of most recent office visit notes, need for blood work, education on nutrition and dietary approach to stop hypertension, health maintenance items, prognosis, documentation and need for follow-up.  Essential hypertension Elevated blood pressure readings at home and in the office. Will increase losartan to losartan-HCTZ 100-12.5 mg. Dietary approach to stop hypertension discussed. Advised to continue monitoring blood pressure readings at home and keep a log. Follow-up in 3 months. Jossalyn was seen today for hypertension and medication refill.  Diagnoses and all orders for this visit:  Essential hypertension -     CBC with Differential/Platelet -     Comprehensive metabolic panel -     Lipid panel -      TSH -     Hemoglobin A1c -     losartan-hydrochlorothiazide (HYZAAR) 100-12.5 MG tablet; Take 1 tablet by mouth daily.  Patient Instructions  Hypertension, Adult High blood pressure (hypertension) is when the force of blood pumping through the arteries is too strong. The arteries are the blood vessels that carry blood from the heart throughout the body. Hypertension forces the heart to work harder to pump blood and may cause arteries to become narrow or stiff. Untreated or uncontrolled hypertension can cause a heart attack, heart failure, a stroke, kidney disease, and otherproblems. A blood pressure reading consists of a higher number over a lower number. Ideally, your blood pressure should be below 120/80. The first ("top") number is called the systolic pressure. It is a  measure of the pressure in your arteries as your heart beats. The second ("bottom") number is called the diastolic pressure. It is a measure of the pressure in your arteries as theheart relaxes. What are the causes? The exact cause of this condition is not known. There are some conditions thatresult in or are related to high blood pressure. What increases the risk? Some risk factors for high blood pressure are under your control. The following factors may make you more likely to develop this condition: Smoking. Having type 2 diabetes mellitus, high cholesterol, or both. Not getting enough exercise or physical activity. Being overweight. Having too much fat, sugar, calories, or salt (sodium) in your diet. Drinking too much alcohol. Some risk factors for high blood pressure may be difficult or impossible to change. Some of these factors include: Having chronic kidney disease. Having a family history of high blood pressure. Age. Risk increases with age. Race. You may be at higher risk if you are African American. Gender. Men are at higher risk than women before age 32. After age 4, women are at higher risk than men. Having  obstructive sleep apnea. Stress. What are the signs or symptoms? High blood pressure may not cause symptoms. Very high blood pressure (hypertensive crisis) may cause: Headache. Anxiety. Shortness of breath. Nosebleed. Nausea and vomiting. Vision changes. Severe chest pain. Seizures. How is this diagnosed? This condition is diagnosed by measuring your blood pressure while you are seated, with your arm resting on a flat surface, your legs uncrossed, and your feet flat on the floor. The cuff of the blood pressure monitor will be placed directly against the skin of your upper arm at the level of your heart. It should be measured at least twice using the same arm. Certain conditions cancause a difference in blood pressure between your right and left arms. Certain factors can cause blood pressure readings to be lower or higher than normal for a short period of time: When your blood pressure is higher when you are in a health care provider's office than when you are at home, this is called white coat hypertension. Most people with this condition do not need medicines. When your blood pressure is higher at home than when you are in a health care provider's office, this is called masked hypertension. Most people with this condition may need medicines to control blood pressure. If you have a high blood pressure reading during one visit or you have normal blood pressure with other risk factors, you may be asked to: Return on a different day to have your blood pressure checked again. Monitor your blood pressure at home for 1 week or longer. If you are diagnosed with hypertension, you may have other blood or imaging tests to help your health care provider understand your overall risk for otherconditions. How is this treated? This condition is treated by making healthy lifestyle changes, such as eating healthy foods, exercising more, and reducing your alcohol intake. Your health care provider may prescribe  medicine if lifestyle changes are not enough to get your blood pressure under control, and if: Your systolic blood pressure is above 130. Your diastolic blood pressure is above 80. Your personal target blood pressure may vary depending on your medicalconditions, your age, and other factors. Follow these instructions at home: Eating and drinking  Eat a diet that is high in fiber and potassium, and low in sodium, added sugar, and fat. An example eating plan is called the DASH (Dietary Approaches to Stop Hypertension)  diet. To eat this way: Eat plenty of fresh fruits and vegetables. Try to fill one half of your plate at each meal with fruits and vegetables. Eat whole grains, such as whole-wheat pasta, brown rice, or whole-grain bread. Fill about one fourth of your plate with whole grains. Eat or drink low-fat dairy products, such as skim milk or low-fat yogurt. Avoid fatty cuts of meat, processed or cured meats, and poultry with skin. Fill about one fourth of your plate with lean proteins, such as fish, chicken without skin, beans, eggs, or tofu. Avoid pre-made and processed foods. These tend to be higher in sodium, added sugar, and fat. Reduce your daily sodium intake. Most people with hypertension should eat less than 1,500 mg of sodium a day. Do not drink alcohol if: Your health care provider tells you not to drink. You are pregnant, may be pregnant, or are planning to become pregnant. If you drink alcohol: Limit how much you use to: 0-1 drink a day for women. 0-2 drinks a day for men. Be aware of how much alcohol is in your drink. In the [place] one drink equals one 12 oz bottle of beer (355 mL), one 5 oz glass of wine (148 mL), or one 1 oz glass of hard liquor (44 mL).  Lifestyle  Work with your health care provider to maintain a healthy body weight or to lose weight. Ask what an ideal weight is for you. Get at least 30 minutes of exercise most days of the week. Activities may include  walking, swimming, or biking. Include exercise to strengthen your muscles (resistance exercise), such as Pilates or lifting weights, as part of your weekly exercise routine. Try to do these types of exercises for 30 minutes at least 3 days a week. Do not use any products that contain nicotine or tobacco, such as cigarettes, e-cigarettes, and chewing tobacco. If you need help quitting, ask your health care provider. Monitor your blood pressure at home as told by your health care provider. Keep all follow-up visits as told by your health care provider. This is important.  Medicines Take over-the-counter and prescription medicines only as told by your health care provider. Follow directions carefully. Blood pressure medicines must be taken as prescribed. Do not skip doses of blood pressure medicine. Doing this puts you at risk for problems and can make the medicine less effective. Ask your health care provider about side effects or reactions to medicines that you should watch for. Contact a health care provider if you: Think you are having a reaction to a medicine you are taking. Have headaches that keep coming back (recurring). Feel dizzy. Have swelling in your ankles. Have trouble with your vision. Get help right away if you: Develop a severe headache or confusion. Have unusual weakness or numbness. Feel faint. Have severe pain in your chest or abdomen. Vomit repeatedly. Have trouble breathing. Summary Hypertension is when the force of blood pumping through your arteries is too strong. If this condition is not controlled, it may put you at risk for serious complications. Your personal target blood pressure may vary depending on your medical conditions, your age, and other factors. For most people, a normal blood pressure is less than 120/80. Hypertension is treated with lifestyle changes, medicines, or a combination of both. Lifestyle changes include losing weight, eating a healthy,  low-sodium diet, exercising more, and limiting alcohol. This information is not intended to replace advice given to you by your health care provider. Make sure you discuss  any questions you have with your healthcare provider. Document Revised: 06/15/2018 Document Reviewed: 06/15/2018 Elsevier Patient Education  2022 [employer].   Edwina BarthMiguel Shanea Karney, MD [facility]

## 2021-07-15 ENCOUNTER — Ambulatory Visit (INDEPENDENT_AMBULATORY_CARE_PROVIDER_SITE_OTHER): Payer: PRIVATE HEALTH INSURANCE | Admitting: Emergency Medicine

## 2021-07-15 ENCOUNTER — Other Ambulatory Visit: Payer: Self-pay

## 2021-07-15 ENCOUNTER — Encounter: Payer: Self-pay | Admitting: Emergency Medicine

## 2021-07-15 VITALS — BP 132/72 | HR 82 | Temp 98.1°F | Ht 64.0 in | Wt 168.0 lb

## 2021-07-15 DIAGNOSIS — Z23 Encounter for immunization: Secondary | ICD-10-CM

## 2021-07-15 DIAGNOSIS — I10 Essential (primary) hypertension: Secondary | ICD-10-CM

## 2021-07-15 MED ORDER — LOSARTAN POTASSIUM-HCTZ 100-12.5 MG PO TABS: 1.0000 | ORAL_TABLET | Freq: Every day | ORAL | 3 refills | Status: AC

## 2021-07-15 NOTE — Patient Instructions (Signed)

## 2021-07-15 NOTE — Progress Notes (Signed)
Consepcion Young 65 y.o.   Chief Complaint  Patient presents with   Hypertension    3 MONTH F/U. Refill of losartan-hydrochlorothiazide    HISTORY OF PRESENT ILLNESS: This is a 65 y.o. female with a history of hypertension on losartan-HCTZ 100-12.5 mg daily here for follow-up and medication refill. Doing well.  Tolerating medication well.  Has no complaints or medical concerns today. BP Readings from Last 3 Encounters:  07/15/21 132/72  04/14/21 (!) 160/90  10/14/20 (!) 170/78    Hypertension Pertinent negatives include no chest pain, headaches, palpitations or shortness of breath.    Prior to Admission medications   Medication Sig Start Date End Date Taking? Authorizing Provider  acetaminophen (TYLENOL) 500 MG tablet Take 500 mg by mouth every 6 (six) hours as needed for pain.   Yes [provider]  Apoaequorin (PREVAGEN PO) Take by mouth daily.   Yes [provider]  aspirin EC 81 MG tablet Take 81 mg by mouth daily. Swallow whole.   Yes [provider]  losartan-hydrochlorothiazide (HYZAAR) 100-12.5 MG tablet Take 1 tablet by mouth daily. 04/14/21  Yes Matteo Banke, Eilleen Kempf, MD  traMADol (ULTRAM) 50 MG tablet Take 50 mg by mouth every 6 (six) hours as needed for pain.   Yes [provider]  meclizine (ANTIVERT) 25 MG tablet Take 1 tablet (25 mg total) by mouth 3 (three) times daily as needed. Patient not taking: No sig reported 03/11/13   Garlon Hatchet, PA-C  oxyCODONE-acetaminophen (PERCOCET/ROXICET) 5-325 MG per tablet Take 1 tablet by mouth every 4 (four) hours as needed for pain. Patient not taking: No sig reported 03/11/13   Garlon Hatchet, PA-C    Allergies  Allergen Reactions   Prednisone Other (See Comments)    Headache redness    Patient Active Problem List   Diagnosis Date Noted   Essential hypertension 04/14/2021   Polyarthralgia 06/07/2012   Rheumatoid factor positive 06/07/2012    Past Medical History:  Diagnosis Date    Arthritis     No past surgical history on file.  Social History   Socioeconomic History   Marital status: Married    Spouse name: Not on file   Number of children: Not on file   Years of education: Not on file   Highest education level: Not on file  Occupational History   Not on file  Tobacco Use   Smoking status: Some Days    Types: Cigarettes    Start date: 07/21/2012   Smokeless tobacco: Never  Substance and Sexual Activity   Alcohol use: No   Drug use: Not on file   Sexual activity: Never    Birth control/protection: Abstinence  Other Topics Concern   Not on file  Social History Narrative   Not on file   Social Determinants of Health   Financial Resource Strain: Not on file  Food Insecurity: Not on file  Transportation Needs: Not on file  Physical Activity: Not on file  Stress: Not on file  Social Connections: Not on file  Intimate Partner Violence: Not on file    No family history on file.   Review of Systems  Constitutional: Negative.  Negative for chills and fever.  HENT: Negative.  Negative for congestion and sore throat.   Respiratory: Negative.  Negative for cough and shortness of breath.   Cardiovascular:  Negative for chest pain and palpitations.  Gastrointestinal:  Negative for abdominal pain, diarrhea, nausea and vomiting.  Genitourinary: Negative.  Negative for dysuria  and hematuria.  Musculoskeletal: Negative.   Skin: Negative.  Negative for rash.  Neurological: Negative.  Negative for dizziness and headaches.  All other systems reviewed and are negative.  Today's Vitals   07/15/21 1400  BP: 132/72  Pulse: 82  Temp: 98.1 F (36.7 C)  TempSrc: Oral  SpO2: 94%  Weight: 168 lb (76.2 kg)  Height: 5\' 4"  (1.626 m)   Body mass index is 28.84 kg/m. Wt Readings from Last 3 Encounters:  07/15/21 168 lb (76.2 kg)  04/14/21 169 lb 12.8 oz (77 kg)  10/14/20 172 lb (78 kg)    Physical Exam Vitals reviewed.  Constitutional:       Appearance: Normal appearance.  HENT:     Head: Normocephalic.  Eyes:     Extraocular Movements: Extraocular movements intact.     Pupils: Pupils are equal, round, and reactive to light.  Cardiovascular:     Rate and Rhythm: Normal rate and regular rhythm.     Pulses: Normal pulses.     Heart sounds: Normal heart sounds.  Pulmonary:     Effort: Pulmonary effort is normal.     Breath sounds: Normal breath sounds.  Musculoskeletal:        General: Normal range of motion.     Cervical back: Normal range of motion and neck supple.  Skin:    General: Skin is warm and dry.     Capillary Refill: Capillary refill takes less than 2 seconds.  Neurological:     General: No focal deficit present.     Mental Status: She is alert and oriented to person, place, and time.  Psychiatric:        Mood and Affect: Mood normal.        Behavior: Behavior normal.     ASSESSMENT & PLAN: Clinically stable.  No medical concerns identified during this visit. Need for mammogram and colonoscopy addressed with patient. She [place] because it is less expensive and easier for her.  Essential hypertension Well-controlled hypertension.  Continue Hyzaar 100-12.5 mg daily. Dietary approaches to stop hypertension discussed. Follow-up in 6 months.  Anita Young was seen today for hypertension.  Diagnoses and all orders for this visit:  Essential hypertension -     losartan-hydrochlorothiazide (HYZAAR) 100-12.5 MG tablet; Take 1 tablet by mouth daily.  Need for influenza vaccination -     Flu Vaccine QUAD High Dose(Fluad)  Patient Instructions  Hypertension, Adult High blood pressure (hypertension) is when the force of blood pumping through the arteries is too strong. The arteries are the blood vessels that carry blood from the heart throughout the body. Hypertension forces the heart to work harder to pump blood and may cause arteries to become narrow or stiff.  Untreated or uncontrolled hypertension can cause a heart attack, heart failure, a stroke, kidney disease, and other problems. A blood pressure reading consists of a higher number over a lower number. Ideally, your blood pressure should be below 120/80. The first ("top") number is called the systolic pressure. It is a measure of the pressure in your arteries as your heart beats. The second ("bottom") number is called the diastolic pressure. It is a measure of the pressure in your arteries as the heart relaxes. What are the causes? The exact cause of this condition is not known. There are some conditions that result in or are related to high blood pressure. What increases the risk? Some risk factors for high blood pressure  are under your control. The following factors may make you more likely to develop this condition: Smoking. Having type 2 diabetes mellitus, high cholesterol, or both. Not getting enough exercise or physical activity. Being overweight. Having too much fat, sugar, calories, or salt (sodium) in your diet. Drinking too much alcohol. Some risk factors for high blood pressure may be difficult or impossible to change. Some of these factors include: Having chronic kidney disease. Having a family history of high blood pressure. Age. Risk increases with age. Race. You may be at higher risk if you are African American. Gender. Men are at higher risk than women before age 49. After age 69, women are at higher risk than men. Having obstructive sleep apnea. Stress. What are the signs or symptoms? High blood pressure may not cause symptoms. Very high blood pressure (hypertensive crisis) may cause: Headache. Anxiety. Shortness of breath. Nosebleed. Nausea and vomiting. Vision changes. Severe chest pain. Seizures. How is this diagnosed? This condition is diagnosed by measuring your blood pressure while you are seated, with your arm resting on a flat surface, your legs uncrossed, and  your feet flat on the floor. The cuff of the blood pressure monitor will be placed directly against the skin of your upper arm at the level of your heart. It should be measured at least twice using the same arm. Certain conditions can cause a difference in blood pressure between your right and left arms. Certain factors can cause blood pressure readings to be lower or higher than normal for a short period of time: When your blood pressure is higher when you are in a health care provider's office than when you are at home, this is called white coat hypertension. Most people with this condition do not need medicines. When your blood pressure is higher at home than when you are in a health care provider's office, this is called masked hypertension. Most people with this condition may need medicines to control blood pressure. If you have a high blood pressure reading during one visit or you have normal blood pressure with other risk factors, you may be asked to: Return on a different day to have your blood pressure checked again. Monitor your blood pressure at home for 1 week or longer. If you are diagnosed with hypertension, you may have other blood or imaging tests to help your health care provider understand your overall risk for other conditions. How is this treated? This condition is treated by making healthy lifestyle changes, such as eating healthy foods, exercising more, and reducing your alcohol intake. Your health care provider may prescribe medicine if lifestyle changes are not enough to get your blood pressure under control, and if: Your systolic blood pressure is above 130. Your diastolic blood pressure is above 80. Your personal target blood pressure may vary depending on your medical conditions, your age, and other factors. Follow these instructions at home: Eating and drinking  Eat a diet that is high in fiber and potassium, and low in sodium, added sugar, and fat. An example eating plan  is called the DASH (Dietary Approaches to Stop Hypertension) diet. To eat this way: Eat plenty of fresh fruits and vegetables. Try to fill one half of your plate at each meal with fruits and vegetables. Eat whole grains, such as whole-wheat pasta, brown rice, or whole-grain bread. Fill about one fourth of your plate with whole grains. Eat or drink low-fat dairy products, such as skim milk or low-fat yogurt. Avoid fatty cuts of meat,  processed or cured meats, and poultry with skin. Fill about one fourth of your plate with lean proteins, such as fish, chicken without skin, beans, eggs, or tofu. Avoid pre-made and processed foods. These tend to be higher in sodium, added sugar, and fat. Reduce your daily sodium intake. Most people with hypertension should eat less than 1,500 mg of sodium a day. Do not drink alcohol if: Your health care provider tells you not to drink. You are pregnant, may be pregnant, or are planning to become pregnant. If you drink alcohol: Limit how much you use to: 0-1 drink a day for women. 0-2 drinks a day for men. Be aware of how much alcohol is in your drink. In the [place] one drink equals one 12 oz bottle of beer (355 mL), one 5 oz glass of wine (148 mL), or one 1 oz glass of hard liquor (44 mL). Lifestyle  Work with your health care provider to maintain a healthy body weight or to lose weight. Ask what an ideal weight is for you. Get at least 30 minutes of exercise most days of the week. Activities may include walking, swimming, or biking. Include exercise to strengthen your muscles (resistance exercise), such as Pilates or lifting weights, as part of your weekly exercise routine. Try to do these types of exercises for 30 minutes at least 3 days a week. Do not use any products that contain nicotine or tobacco, such as cigarettes, e-cigarettes, and chewing tobacco. If you need help quitting, ask your health care provider. Monitor your blood pressure at home as told by your  health care provider. Keep all follow-up visits as told by your health care provider. This is important. Medicines Take over-the-counter and prescription medicines only as told by your health care provider. Follow directions carefully. Blood pressure medicines must be taken as prescribed. Do not skip doses of blood pressure medicine. Doing this puts you at risk for problems and can make the medicine less effective. Ask your health care provider about side effects or reactions to medicines that you should watch for. Contact a health care provider if you: Think you are having a reaction to a medicine you are taking. Have headaches that keep coming back (recurring). Feel dizzy. Have swelling in your ankles. Have trouble with your vision. Get help right away if you: Develop a severe headache or confusion. Have unusual weakness or numbness. Feel faint. Have severe pain in your chest or abdomen. Vomit repeatedly. Have trouble breathing. Summary Hypertension is when the force of blood pumping through your arteries is too strong. If this condition is not controlled, it may put you at risk for serious complications. Your personal target blood pressure may vary depending on your medical conditions, your age, and other factors. For most people, a normal blood pressure is less than 120/80. Hypertension is treated with lifestyle changes, medicines, or a combination of both. Lifestyle changes include losing weight, eating a healthy, low-sodium diet, exercising more, and limiting alcohol. This information is not intended to replace advice given to you by your health care provider. Make sure you discuss any questions you have with your health care provider. Document Revised: 06/15/2018 Document Reviewed: 06/15/2018 Elsevier Patient Education  2022 Elsevier Inc.    Edwina Barth, MD [facility]

## 2021-07-15 NOTE — Assessment & Plan Note (Signed)
Well-controlled hypertension.  Continue Hyzaar 100-12.5 mg daily. Dietary approaches to stop hypertension discussed. Follow-up in 6 months.

## 2022-01-13 ENCOUNTER — Ambulatory Visit: Payer: PRIVATE HEALTH INSURANCE | Admitting: Emergency Medicine

## 2022-06-15 DIAGNOSIS — S0993XA Unspecified injury of face, initial encounter: Secondary | ICD-10-CM | POA: Diagnosis not present

## 2022-06-15 DIAGNOSIS — I10 Essential (primary) hypertension: Secondary | ICD-10-CM | POA: Diagnosis not present

## 2022-06-15 DIAGNOSIS — R296 Repeated falls: Secondary | ICD-10-CM | POA: Diagnosis not present

## 2022-06-15 DIAGNOSIS — R9431 Abnormal electrocardiogram [ECG] [EKG]: Secondary | ICD-10-CM | POA: Diagnosis not present

## 2022-06-15 DIAGNOSIS — E860 Dehydration: Secondary | ICD-10-CM | POA: Diagnosis not present

## 2022-06-15 DIAGNOSIS — R55 Syncope and collapse: Secondary | ICD-10-CM | POA: Diagnosis not present

## 2022-06-15 DIAGNOSIS — N390 Urinary tract infection, site not specified: Secondary | ICD-10-CM | POA: Diagnosis not present

## 2022-06-22 ENCOUNTER — Encounter (HOSPITAL_BASED_OUTPATIENT_CLINIC_OR_DEPARTMENT_OTHER): Payer: Self-pay | Admitting: Emergency Medicine

## 2022-06-22 ENCOUNTER — Other Ambulatory Visit: Payer: Self-pay

## 2022-06-22 ENCOUNTER — Emergency Department (HOSPITAL_BASED_OUTPATIENT_CLINIC_OR_DEPARTMENT_OTHER): Payer: PPO

## 2022-06-22 ENCOUNTER — Emergency Department (HOSPITAL_BASED_OUTPATIENT_CLINIC_OR_DEPARTMENT_OTHER)
Admission: EM | Admit: 2022-06-22 | Discharge: 2022-06-23 | Disposition: A | Discharge status: Home / Self Care | Payer: PPO | Attending: Emergency Medicine | Admitting: Emergency Medicine

## 2022-06-22 DIAGNOSIS — Z7982 Long term (current) use of aspirin: Secondary | ICD-10-CM | POA: Insufficient documentation

## 2022-06-22 DIAGNOSIS — U071 COVID-19: Secondary | ICD-10-CM | POA: Diagnosis not present

## 2022-06-22 DIAGNOSIS — I10 Essential (primary) hypertension: Secondary | ICD-10-CM | POA: Diagnosis not present

## 2022-06-22 DIAGNOSIS — D72829 Elevated white blood cell count, unspecified: Secondary | ICD-10-CM | POA: Diagnosis not present

## 2022-06-22 DIAGNOSIS — R509 Fever, unspecified: Secondary | ICD-10-CM | POA: Diagnosis not present

## 2022-06-22 DIAGNOSIS — Z79899 Other long term (current) drug therapy: Secondary | ICD-10-CM | POA: Insufficient documentation

## 2022-06-22 DIAGNOSIS — J1282 Pneumonia due to coronavirus disease 2019: Secondary | ICD-10-CM | POA: Insufficient documentation

## 2022-06-22 LAB — CBC WITH DIFFERENTIAL/PLATELET
Abs Immature Granulocytes: 0.06 10*3/uL (ref 0.00–0.07)
Basophils Absolute: 0 10*3/uL (ref 0.0–0.1)
Basophils Relative: 0 %
Eosinophils Absolute: 0 10*3/uL (ref 0.0–0.5)
Eosinophils Relative: 0 %
HCT: 34.1 % — ABNORMAL LOW (ref 36.0–46.0)
Hemoglobin: 11.5 g/dL — ABNORMAL LOW (ref 12.0–15.0)
Immature Granulocytes: 1 %
Lymphocytes Relative: 14 %
Lymphs Abs: 1.2 10*3/uL (ref 0.7–4.0)
MCH: 30.3 pg (ref 26.0–34.0)
MCHC: 33.7 g/dL (ref 30.0–36.0)
MCV: 89.7 fL (ref 80.0–100.0)
Monocytes Absolute: 1.3 10*3/uL — ABNORMAL HIGH (ref 0.1–1.0)
Monocytes Relative: 15 %
Neutro Abs: 5.9 10*3/uL (ref 1.7–7.7)
Neutrophils Relative %: 70 %
Platelets: 234 10*3/uL (ref 150–400)
RBC: 3.8 MIL/uL — ABNORMAL LOW (ref 3.87–5.11)
RDW: 12.6 % (ref 11.5–15.5)
WBC: 8.5 10*3/uL (ref 4.0–10.5)
nRBC: 0 % (ref 0.0–0.2)

## 2022-06-22 LAB — URINALYSIS, MICROSCOPIC (REFLEX): RBC / HPF: NONE SEEN RBC/hpf (ref 0–5)

## 2022-06-22 LAB — RESP PANEL BY RT-PCR (FLU A&B, COVID) ARPGX2
Influenza A by PCR: NEGATIVE
Influenza B by PCR: NEGATIVE
SARS Coronavirus 2 by RT PCR: POSITIVE — AB

## 2022-06-22 LAB — URINALYSIS, ROUTINE W REFLEX MICROSCOPIC
Glucose, UA: NEGATIVE mg/dL
Hgb urine dipstick: NEGATIVE
Ketones, ur: NEGATIVE mg/dL
Nitrite: NEGATIVE
Protein, ur: 100 mg/dL — AB
Specific Gravity, Urine: 1.015 (ref 1.005–1.030)
pH: 8.5 — ABNORMAL HIGH (ref 5.0–8.0)

## 2022-06-22 LAB — COMPREHENSIVE METABOLIC PANEL
ALT: 31 U/L (ref 0–44)
AST: 22 U/L (ref 15–41)
Albumin: 3.1 g/dL — ABNORMAL LOW (ref 3.5–5.0)
Alkaline Phosphatase: 90 U/L (ref 38–126)
Anion gap: 7 (ref 5–15)
BUN: 11 mg/dL (ref 8–23)
CO2: 26 mmol/L (ref 22–32)
Calcium: 8.2 mg/dL — ABNORMAL LOW (ref 8.9–10.3)
Chloride: 104 mmol/L (ref 98–111)
Creatinine, Ser: 0.67 mg/dL (ref 0.44–1.00)
GFR, Estimated: >60 mL/min (ref >60)
Glucose, Bld: 104 mg/dL — ABNORMAL HIGH (ref 70–99)
Potassium: 3.5 mmol/L (ref 3.5–5.1)
Sodium: 137 mmol/L (ref 135–145)
Total Bilirubin: 0.9 mg/dL (ref 0.3–1.2)
Total Protein: 6.7 g/dL (ref 6.5–8.1)

## 2022-06-22 MED ORDER — SODIUM CHLORIDE 0.9 % IV BOLUS
1000.0000 mL | Freq: Once | INTRAVENOUS | Status: AC
  Administered 2022-06-22: 1000 mL via INTRAVENOUS

## 2022-06-22 MED ORDER — NIRMATRELVIR/RITONAVIR (PAXLOVID)TABLET: 3.0000 | ORAL_TABLET | Freq: Two times a day (BID) | ORAL | 0 refills | Status: AC

## 2022-06-22 MED ORDER — ONDANSETRON HCL 4 MG/2ML IJ SOLN
4.0000 mg | Freq: Once | INTRAMUSCULAR | Status: AC
  Administered 2022-06-22: 4 mg via INTRAVENOUS
  Filled 2022-06-22: qty 2

## 2022-06-22 MED ORDER — ACETAMINOPHEN 325 MG PO TABS
650.0000 mg | ORAL_TABLET | Freq: Once | ORAL | Status: AC
Start: 2022-06-22 — End: 2022-06-22
  Administered 2022-06-22: 650 mg via ORAL
  Filled 2022-06-22: qty 2

## 2022-06-22 NOTE — ED Triage Notes (Signed)
Patient arrived via POV c/o fever and migraine x 1 week. Patient seen previously at [facility], dx of dehydration and UTI. Patient states taking medications as prescribed. Patient endorses no energy with fever at this time. Patient is AO x 4, VS w/ elevated temp, unable to walk at this time.

## 2022-06-22 NOTE — ED Provider Notes (Signed)
[facility] EMERGENCY DEPARTMENT Provider Note   CSN: [id number] Arrival date & time: 06/22/22  1952     History  Chief Complaint  Patient presents with   Migraine   Fever    Anita Young is a 66 y.o. female.  With past medical history of hypertension who presents to the emergency department with fever.  Patient states that she has been feeling generally unwell over the past 5 to 7 days.  She describes feeling fatigued and tired and weak.  She has had decreased appetite.  Endorses subjective fever at home.  She has also had nausea without vomiting.  Denies hematemesis.  She denies any cough or shortness of breath or chest pain.  She denies any abdominal pain or diarrhea.  Denies dysuria.  States that her husband is also ill with similar symptoms.   Migraine Associated symptoms include headaches. Pertinent negatives include no chest pain, no abdominal pain and no shortness of breath.  Fever Associated symptoms: headaches and nausea   Associated symptoms: no chest pain, no congestion, no cough, no dysuria and no vomiting        Home Medications Prior to Admission medications   Medication Sig Start Date End Date Taking? Authorizing Provider  nirmatrelvir/ritonavir EUA (PAXLOVID) 20 x 150 MG & 10 x 100MG  TABS Take 3 tablets by mouth 2 (two) times daily for 5 days. Patient GFR is 60. Take nirmatrelvir (150 mg) two tablets twice daily for 5 days and ritonavir (100 mg) one tablet twice daily for 5 days. 06/22/22 06/27/22 Yes 08/27/22, PA-C  acetaminophen (TYLENOL) 500 MG tablet Take 500 mg by mouth every 6 (six) hours as needed for pain.    [provider]  Apoaequorin (PREVAGEN PO) Take by mouth daily.    [provider]  aspirin EC 81 MG tablet Take 81 mg by mouth daily. Swallow whole.    [provider]  losartan-hydrochlorothiazide (HYZAAR) 100-12.5 MG tablet Take 1 tablet by mouth daily. 07/15/21   07/17/21, MD  traMADol (ULTRAM)  50 MG tablet Take 50 mg by mouth every 6 (six) hours as needed for pain.    [provider]      Allergies    Prednisone    Review of Systems   Review of Systems  Constitutional:  Positive for appetite change and fever.  HENT:  Negative for congestion.   Respiratory:  Negative for cough and shortness of breath.   Cardiovascular:  Negative for chest pain.  Gastrointestinal:  Positive for nausea. Negative for abdominal pain and vomiting.  Genitourinary:  Negative for dysuria.  Neurological:  Positive for headaches.  All other systems reviewed and are negative.   Physical Exam Updated Vital Signs BP (!) 140/59   Pulse 74   Temp 98.4 F (36.9 C) (Oral)   Resp (!) 25   Ht 5\' 4"  (1.626 m)   Wt 70 kg   SpO2 94%   BMI 26.49 kg/m  Physical Exam Vitals and nursing note reviewed.  Constitutional:      General: She is not in acute distress.    Appearance: She is ill-appearing. She is not toxic-appearing.  HENT:     Head: Normocephalic.     Nose: Nose normal.     Mouth/Throat:     Mouth: Mucous membranes are moist.     Pharynx: Oropharynx is clear.  Eyes:     General: No scleral icterus.    Extraocular Movements: Extraocular movements intact.  Pupils: Pupils are equal, round, and reactive to light.  Cardiovascular:     Rate and Rhythm: Normal rate and regular rhythm.     Pulses: Normal pulses.     Heart sounds: No murmur heard. Pulmonary:     Effort: Pulmonary effort is normal. No respiratory distress.     Breath sounds: Normal breath sounds.  Abdominal:     General: Bowel sounds are normal. There is no distension.     Palpations: Abdomen is soft.     Tenderness: There is no abdominal tenderness.  Musculoskeletal:        General: Normal range of motion.     Cervical back: Neck supple.  Skin:    General: Skin is warm and dry.     Capillary Refill: Capillary refill takes less than 2 seconds.  Neurological:     General: No focal deficit present.     Mental  Status: She is alert and oriented to person, place, and time. Mental status is at baseline.  Psychiatric:        Mood and Affect: Mood normal.        Behavior: Behavior normal.        Thought Content: Thought content normal.        Judgment: Judgment normal.     ED Results / Procedures / Treatments   Labs (all labs ordered are listed, but only abnormal results are displayed) Labs Reviewed  URINE CULTURE - Abnormal; Notable for the following components:      Result Value   Culture 70,000 COLONIES/mL PSEUDOMONAS AERUGINOSA (*)    Organism ID, Bacteria PSEUDOMONAS AERUGINOSA (*)    All other components within normal limits  RESP PANEL BY RT-PCR (FLU A&B, COVID) ARPGX2 - Abnormal; Notable for the following components:   SARS Coronavirus 2 by RT PCR POSITIVE (*)    All other components within normal limits  COMPREHENSIVE METABOLIC PANEL - Abnormal; Notable for the following components:   Glucose, Bld 104 (*)    Calcium 8.2 (*)    Albumin 3.1 (*)    All other components within normal limits  CBC WITH DIFFERENTIAL/PLATELET - Abnormal; Notable for the following components:   RBC 3.80 (*)    Hemoglobin 11.5 (*)    HCT 34.1 (*)    Monocytes Absolute 1.3 (*)    All other components within normal limits  URINALYSIS, ROUTINE W REFLEX MICROSCOPIC - Abnormal; Notable for the following components:   pH 8.5 (*)    Bilirubin Urine SMALL (*)    Protein, ur 100 (*)    Leukocytes,Ua SMALL (*)    All other components within normal limits  URINALYSIS, MICROSCOPIC (REFLEX) - Abnormal; Notable for the following components:   Bacteria, UA RARE (*)    All other components within normal limits    EKG EKG Interpretation  Date/Time:  Monday June 22 2022 21:58:32 EDT Ventricular Rate:  71 PR Interval:  156 QRS Duration: 91 QT Interval:  416 QTC Calculation: 453 R Axis:   -20 Text Interpretation: Sinus rhythm Borderline left axis deviation Confirmed by Vivien Rossetti ([id number]) on 06/22/2022  10:50:34 PM  Radiology No results found.  Procedures Procedures   Medications Ordered in ED Medications  acetaminophen (TYLENOL) tablet 650 mg (650 mg Oral Given 06/22/22 2117)  sodium chloride 0.9 % bolus 1,000 mL (0 mLs Intravenous Stopped 06/22/22 2359)  ondansetron (ZOFRAN) injection 4 mg (4 mg Intravenous Given 06/22/22 2147)    ED Course/ Medical Decision Making/ A&P  Medical Decision Making Amount and/or Complexity of Data Reviewed Labs: ordered. Radiology: ordered.  Risk OTC drugs. Prescription drug management.  This patient presents to the ED with chief complaint(s) of fatigue with pertinent past medical history of arthritis which further complicates the presenting complaint. The complaint involves an extensive differential diagnosis and also carries with it a high risk of complications and morbidity.    The differential diagnosis includes viral infection, UTI, other intra-abdominal pathology, pneumonia, heart failure, toxic ingestion, etc.  Additional history obtained: Additional history obtained from  none available Records reviewed Care Everywhere/External Records most recent ED physician note  ED Course and Reassessment: 66 year old female who presents to the emergency department with generalized weakness and fatigue. Physical exam is relatively unremarkable.  She does appear tired appearing but no acute findings.  She is alert and oriented.  Maintaining her O2 sat without tachypnea.  There is no adventitious lung sounds.  Abdomen is nontender. Obtaining basic labs as well as COVID and flu, EKG. EKG without ischemia or infarction Are basic labs are unremarkable. COVID and flu is positive for COVID.  This is likely the source of her fatigue.  Her symptoms are viral with fatigue, weakness, body aches. Obtain chest x-ray given age, fever.  Chest x-ray does not demonstrate likely pneumonia. Given Tylenol, Zofran for nausea and IV fluids which  seems to have improved her symptoms. I ambulated her with O2 monitoring and she did not desat below 92%.  She did not become significantly short of breath.  Given that her symptoms are within the past 5 days, we will prescribe her Paxlovid.  Her urine was questionably dirty but she has no symptoms.  We will send off urine culture.  This can be followed up in primary care. Discussed the findings with the patient and she verbalized understanding.  Discussed isolation precautions.  Discussed strict return precautions for worsening shortness of breath or difficulty breathing.  She verbalized understanding.  Otherwise feel that she is safe for discharge with primary care follow-up at this time.  Independent labs interpretation:  The following labs were independently interpreted: CBC with stable anemia, CMP essentially within normal limits there is no AKI, severe electrolyte derangement, transaminitis.  COVID positive.  UA with small leukocytes and bacteria.  Sent off urine culture.  Independent visualization of imaging: - I independently visualized the following imaging with scope of interpretation limited to determining acute life threatening conditions related to emergency care: Chest x-ray, which revealed possible pneumonia  Consultation: - Consulted or discussed management/test interpretation w/ external professional: N/A  Consideration for admission or further workup: Considered admission giving age, likely COVID-pneumonia, however she is mentating, maintaining O2 sat on room air even when ambulating, no severe lab abnormalities. Social Determinants of health: None identified Final Clinical Impression(s) / ED Diagnoses Final diagnoses:  Pneumonia due to COVID-19 virus    Rx / DC Orders ED Discharge Orders          Ordered    nirmatrelvir/ritonavir EUA (PAXLOVID) 20 x 150 MG & 10 x 100MG  TABS  2 times daily        06/22/22 2337              [id number], PA-C 06/26/22 08/26/22     [id number], MD 06/27/22 1119

## 2022-06-22 NOTE — Discharge Instructions (Addendum)
You were seen in the emergency department today for feeling unwell. You have COVID-19. You also have pneumonia which is likely related to COVID-19. We are placing you on medication for this.  This medication is called Paxlovid.  You will take it twice a day for 5 days.  Please follow-up with your primary care provider in 3 days to have a recheck of your symptoms. Please return immediately if you begin to feel short of breath or having difficulty breathing. Additionally, please take tylenol and motrin as needed for relief of body aches and fever. Please drink plenty of fluids and rest.

## 2022-06-22 NOTE — ED Notes (Signed)
Pt ambulated in room with a pulse ox on her finger. Pt O2 stats was 93% and 94% the entire time. Pt denies any SOB.

## 2022-06-24 LAB — URINE CULTURE: Culture: 70000 — AB

## 2022-06-25 NOTE — Progress Notes (Signed)
ED Antimicrobial Stewardship Positive Culture Follow Up   Anita Young is an 66 y.o. female who presented to [facility] on 06/22/2022 with a chief complaint of  Chief Complaint  Patient presents with   Migraine   Fever    Recent Results (from the past 720 hour(s))  Urine Culture     Status: Abnormal   Collection Time: 06/22/22  8:34 PM   Specimen: Urine, Clean Catch  Result Value Ref Range Status   Specimen Description   Final    URINE, CLEAN CATCH Performed at [facility], [address] [place], [place] [address]    Special Requests   Final    NONE Performed at [facility], [address] [place], [place] [address]    Culture 70,000 COLONIES/mL PSEUDOMONAS AERUGINOSA (A)  Final   Report Status 06/24/2022 FINAL  Final   Organism ID, Bacteria PSEUDOMONAS AERUGINOSA (A)  Final      Susceptibility   Pseudomonas aeruginosa - MIC*    CEFTAZIDIME 4 SENSITIVE Sensitive     CIPROFLOXACIN 0.5 SENSITIVE Sensitive     GENTAMICIN 2 SENSITIVE Sensitive     IMIPENEM 2 SENSITIVE Sensitive     PIP/TAZO 8 SENSITIVE Sensitive     CEFEPIME 2 SENSITIVE Sensitive     * 70,000 COLONIES/mL PSEUDOMONAS AERUGINOSA  Resp Panel by RT-PCR (Flu A&B, Covid) Anterior Nasal Swab     Status: Abnormal   Collection Time: 06/22/22  9:09 PM   Specimen: Anterior Nasal Swab  Result Value Ref Range Status   SARS Coronavirus 2 by RT PCR POSITIVE (A) NEGATIVE Final    Comment: (NOTE) SARS-CoV-2 target nucleic acids are DETECTED.  The SARS-CoV-2 RNA is generally detectable in upper respiratory specimens during the acute phase of infection. Positive results are indicative of the presence of the identified virus, but do not rule out bacterial infection or co-infection with other pathogens not detected by the test. Clinical correlation with patient history and other diagnostic information is necessary to determine patient infection status. The expected result is Negative.  Fact Sheet  for Patients: BloggerCourse.com  Fact Sheet for Healthcare Providers: SeriousBroker.it  This test is not yet approved or cleared by the [place] FDA and  has been authorized for detection and/or diagnosis of SARS-CoV-2 by FDA under an Emergency Use Authorization (EUA).  This EUA will remain in effect (meaning this test can be used) for the duration of  the COVID-19 declaration under Section 564(b)(1) of the A ct, 21 U.S.C. section 360bbb-3(b)(1), unless the authorization is terminated or revoked sooner.     Influenza A by PCR NEGATIVE NEGATIVE Final   Influenza B by PCR NEGATIVE NEGATIVE Final    Comment: (NOTE) The Xpert Xpress SARS-CoV-2/FLU/RSV plus assay is intended as an aid in the diagnosis of influenza from Nasopharyngeal swab specimens and should not be used as a sole basis for treatment. Nasal washings and aspirates are unacceptable for Xpert Xpress SARS-CoV-2/FLU/RSV testing.  Fact Sheet for Patients: BloggerCourse.com  Fact Sheet for Healthcare Providers: SeriousBroker.it  This test is not yet approved or cleared by the [place] FDA and has been authorized for detection and/or diagnosis of SARS-CoV-2 by FDA under an Emergency Use Authorization (EUA). This EUA will remain in effect (meaning this test can be used) for the duration of the COVID-19 declaration under Section 564(b)(1) of the Act, 21 U.S.C. section 360bbb-3(b)(1), unless the authorization is terminated or revoked.  Performed at [facility], [address] [place], [place] [address]    66 YOF with migraine and fever. Information reviewed from incomplete chart note by EDP. Reported nausea and vomiting as well as subjective fever. No abdominal pain and no dysuria. Did have Tmax 100.4 and lightheadedness. Husband also reported to be sick. Patient previously seen 8/28 at [facility] ED  for HA and syncopal episodes. UA at that time showed WBC 3-5, 5-10 squamous cells, LE 250, +1 bacteria. Patient was negative for dysuria and hematuria at this time. Was prescribed cephalexin for 7 days. UA obtained here showed 6-10 WBC, rare bacteria, small leukocytes, 0-5 squamous cells. Received Zofran and IV fluids. Discussed with Anita Melia, PA-C. Suspect asymptomatic bacteriuria. No antibiotics indicated.    New antibiotic prescription: none   ED Provider: Army Melia, PA-C  Larena Sox, PharmD PGY1 Pharmacy Resident   06/25/2022  9:16 AM  Clinical Pharmacist Monday - Friday phone -  [phone] Saturday - Sunday phone - [phone]

## 2022-06-30 DIAGNOSIS — M791 Myalgia, unspecified site: Secondary | ICD-10-CM | POA: Diagnosis not present

## 2022-06-30 DIAGNOSIS — Z6828 Body mass index (BMI) 28.0-28.9, adult: Secondary | ICD-10-CM | POA: Diagnosis not present

## 2022-06-30 DIAGNOSIS — Z03818 Encounter for observation for suspected exposure to other biological agents ruled out: Secondary | ICD-10-CM | POA: Diagnosis not present

## 2023-03-30 ENCOUNTER — Telehealth: Payer: Self-pay | Admitting: Licensed Clinical Social Worker

## 2023-03-30 NOTE — Patient Outreach (Signed)
  Care Coordination   03/30/2023 Name: Tessla Spurling MRN: [id number] DOB: 22-Mar-1956   Care Coordination Outreach Attempts:  An unsuccessful telephone outreach was attempted today to offer the patient information about available care coordination services.  Follow Up Plan:  Additional outreach attempts will be made to offer the patient care coordination information and services.   Encounter Outcome:  No Answer   Care Coordination Interventions:  No, not indicated    Sammuel Hines, LCSW Social Work Care Coordination  [facility] [employer] [phone]

## 2023-04-07 ENCOUNTER — Telehealth: Payer: Self-pay | Admitting: Licensed Clinical Social Worker

## 2023-04-07 NOTE — Patient Outreach (Signed)
  Care Coordination   04/07/2023 Name: Anita Young MRN: [id number] DOB: 1956/01/15   Care Coordination Outreach Attempts:  A second unsuccessful outreach was attempted today to offer the patient with information about available care coordination services.  Follow Up Plan:  Additional outreach attempts will be made to offer the patient care coordination information and services.   Encounter Outcome:  No Answer   Care Coordination Interventions:  No, not indicated    Sammuel Hines, LCSW Social Work Care Coordination  [facility] [employer] [phone]

## 2023-04-20 ENCOUNTER — Telehealth: Payer: Self-pay | Admitting: Licensed Clinical Social Worker

## 2023-04-20 NOTE — Patient Outreach (Signed)
  Care Coordination   04/20/2023 Name: Jadelyn Seitz MRN: [id number] DOB: 17-Mar-1956   Care Coordination Outreach Attempts:  A third unsuccessful outreach was attempted today to offer the patient with information about available care coordination services.  Follow Up Plan:  No further outreach attempts will be made at this time. We have been unable to contact the patient to offer or enroll patient in care coordination services  Encounter Outcome:  No Answer   Care Coordination Interventions:  No, not indicated    Sammuel Hines, LCSW Social Work Care Coordination  [facility] [employer] [employer] [phone]

## 2023-04-20 NOTE — Patient Instructions (Signed)
  Care Coordination provides support specific to your health needs that extend beyond exceptional routine office care you already receive from your primary care doctor.   There is no cost to you for Care Coordination.  The Care Coordination team is made up of the following team members: Registered Nurse Care Manager: disease management, health education, care coordination and complex case management Clinical Social Work: Complex Care Coordination including coordination of level of care needs, mental and behavioral health assessment and recommendations, and connection to long-term mental health support Clinical Pharmacist: medication management, assistance and disease management Community Resource Care Guides: community resource connections  Please call [phone] if you would like to schedule a phone appointment with one of the team members.   Sammuel Hines, LCSW Social Work Care Coordination  [phone]

## 2023-05-07 ENCOUNTER — Telehealth: Payer: Self-pay | Admitting: *Deleted

## 2023-05-07 ENCOUNTER — Encounter: Payer: Self-pay | Admitting: *Deleted

## 2023-05-07 NOTE — Telephone Encounter (Signed)
I attempted to contact patient by telephone but was unsuccessful. According to the patient's chart they are due for follow up  with [facility]. I have left a HIPAA compliant message advising the patient to contact [facility] at [phone]. I will continue to follow up with the patient to make sure this appointment is scheduled.

## 2023-05-13 NOTE — Telephone Encounter (Signed)
I attempted to contact patient by telephone but was unsuccessful. According to the patient's chart they are due for follow up with  [facility]. I have left a HIPAA compliant message advising the patient to contact [facility] at [phone]. I will continue to follow up with the patient to make sure this appointment is scheduled.

## 2023-08-18 ENCOUNTER — Ambulatory Visit
Admission: EM | Admit: 2023-08-18 | Discharge: 2023-08-18 | Disposition: A | Discharge status: Home / Self Care | Payer: HMO | Attending: Internal Medicine | Admitting: Internal Medicine

## 2023-08-18 ENCOUNTER — Ambulatory Visit: Payer: HMO

## 2023-08-18 DIAGNOSIS — M4185 Other forms of scoliosis, thoracolumbar region: Secondary | ICD-10-CM | POA: Diagnosis not present

## 2023-08-18 DIAGNOSIS — M545 Low back pain, unspecified: Secondary | ICD-10-CM | POA: Diagnosis not present

## 2023-08-18 DIAGNOSIS — M5459 Other low back pain: Secondary | ICD-10-CM | POA: Diagnosis not present

## 2023-08-18 DIAGNOSIS — I10 Essential (primary) hypertension: Secondary | ICD-10-CM | POA: Diagnosis not present

## 2023-08-18 DIAGNOSIS — M5136 Other intervertebral disc degeneration, lumbar region with discogenic back pain only: Secondary | ICD-10-CM | POA: Diagnosis not present

## 2023-08-18 MED ORDER — METHYLPREDNISOLONE 4 MG PO TBPK: ORAL_TABLET | ORAL | 0 refills | Status: AC

## 2023-08-18 MED ORDER — HYDROCHLOROTHIAZIDE 12.5 MG PO TABS: 12.5000 mg | ORAL_TABLET | Freq: Every day | ORAL | 0 refills | Status: AC

## 2023-08-18 MED ORDER — CYCLOBENZAPRINE HCL 5 MG PO TABS: 5.0000 mg | ORAL_TABLET | Freq: Every evening | ORAL | 0 refills | Status: AC | PRN

## 2023-08-18 NOTE — ED Provider Notes (Addendum)
[facility]  Note:  This document was prepared using Conservation officer, historic buildings and may include unintentional dictation errors.  MRN: [id number] DOB: Dec 24, 1955  Subjective:   Anita Young is a 67 y.o. female presenting for 2-day history of acute onset persistent low back pain on either side.  Has been using Tylenol and ibuprofen without any relief.  No fall, trauma, numbness or tingling, saddle paresthesia, changes to bowel or urinary habits, radicular symptoms.  Patient tries to hydrate well.  No urinary symptoms.  She did have signs of mild degenerative changes and osteophytic changes from an x-ray seen in 2014.  Regarding her blood pressure, denies headache, chest pain, shortness of breath, nausea, vomiting, abdominal pain, hematuria.  She does have a PCP she can follow-up with.  Reports that she is compliant with her losartan hydrochlorothiazide.  However, she did not take her blood pressure medication today.  No current facility-administered medications for this encounter.  Current Outpatient Medications:    ibuprofen (ADVIL) 200 MG tablet, Take 200 mg by mouth every 6 (six) hours as needed., Disp: , Rfl:    acetaminophen (TYLENOL) 500 MG tablet, Take 500 mg by mouth every 6 (six) hours as needed for pain., Disp: , Rfl:    Apoaequorin (PREVAGEN PO), Take by mouth daily., Disp: , Rfl:    aspirin EC 81 MG tablet, Take 81 mg by mouth daily. Swallow whole., Disp: , Rfl:    losartan-hydrochlorothiazide (HYZAAR) 100-12.5 MG tablet, Take 1 tablet by mouth daily., Disp: 90 tablet, Rfl: 3   traMADol (ULTRAM) 50 MG tablet, Take 50 mg by mouth every 6 (six) hours as needed for pain., Disp: , Rfl:    Allergies  Allergen Reactions   Prednisone Other (See Comments)    Headache redness    Past Medical History:  Diagnosis Date   Arthritis      History reviewed. No pertinent surgical history.  History reviewed. No pertinent family history.  Social History    Tobacco Use   Smoking status: Some Days    Types: Cigarettes    Start date: 07/21/2012   Smokeless tobacco: Never  Vaping Use   Vaping status: Never Used  Substance Use Topics   Alcohol use: No   Drug use: Never    ROS   Objective:   Vitals: BP (!) 173/75 (BP Location: Right Arm)   Pulse 78   Temp 98.8 F (37.1 C) (Oral)   Resp 18   SpO2 94%   BP Readings from Last 3 Encounters:  08/18/23 (!) 173/75  06/23/22 (!) 140/59  07/15/21 132/72   Physical Exam Constitutional:      General: She is not in acute distress.    Appearance: Normal appearance. She is well-developed. She is not ill-appearing, toxic-appearing or diaphoretic.  HENT:     Head: Normocephalic and atraumatic.     Nose: Nose normal.     Mouth/Throat:     Mouth: Mucous membranes are moist.  Eyes:     General: No scleral icterus.       Right eye: No discharge.        Left eye: No discharge.     Extraocular Movements: Extraocular movements intact.  Cardiovascular:     Rate and Rhythm: Normal rate and regular rhythm.     Heart sounds: Normal heart sounds. No murmur heard.    No friction rub. No gallop.  Pulmonary:     Effort: Pulmonary effort is normal. No respiratory distress.  Breath sounds: No stridor. No wheezing, rhonchi or rales.  Chest:     Chest wall: No tenderness.  Musculoskeletal:     Lumbar back: Spasms and tenderness (lumbar paraspinal muscle) present. No swelling, edema, deformity, signs of trauma, lacerations or bony tenderness. Normal range of motion. Negative right straight leg raise test and negative left straight leg raise test. No scoliosis.  Skin:    General: Skin is warm and dry.  Neurological:     General: No focal deficit present.     Mental Status: She is alert and oriented to person, place, and time.     Cranial Nerves: No cranial nerve deficit.     Motor: No weakness.     Coordination: Coordination normal.     Gait: Gait normal.  Psychiatric:        Mood and  Affect: Mood normal.        Behavior: Behavior normal.     Assessment and Plan :   PDMP not reviewed this encounter.  1. Acute midline low back pain without sciatica   2. Essential hypertension     X-ray over-read was pending at time of discharge, recommended follow up with only abnormal results. Otherwise will not call for negative over-read. Patient was in agreement.  Given the severity of her pain, lack of response to over-the-counter medications offered an oral prednisone course.  Discussed back care, use of a muscle relaxant.  Regarding her blood pressure, I am adding 12.5 mg hydrochlorothiazide to her current regimen.  Follow-up with her PCP as soon as possible.  Will defer ER visit due to your high blood pressure today.  She has a reassuring neurologic and cardiopulmonary exam.  Counseled patient on potential for adverse effects with medications prescribed/recommended today, ER and return-to-clinic precautions discussed, patient verbalized understanding.    Wallis Bamberg, [place] 08/18/23 1415

## 2023-08-18 NOTE — ED Triage Notes (Signed)
Pt reports low back pain x 2 days. Ibuprofen gives no relief.

## 2023-08-18 NOTE — Discharge Instructions (Signed)
I suspect you are having back from because of your arthritis in your back. Please stop taking Motrin, Advil, ibuprofen. Use the Medrol dose pack I am prescribing instead. You can take this with cyclobenzaprine, a muscle relaxant. Remember to take your daily blood pressure medicine. I am adding 1 more but need you to follow up with your regular doctor for a recheck.

## 2023-10-01 DIAGNOSIS — Z1330 Encounter for screening examination for mental health and behavioral disorders, unspecified: Secondary | ICD-10-CM | POA: Diagnosis not present

## 2023-10-01 DIAGNOSIS — M722 Plantar fascial fibromatosis: Secondary | ICD-10-CM | POA: Diagnosis not present

## 2023-10-01 DIAGNOSIS — I10 Essential (primary) hypertension: Secondary | ICD-10-CM | POA: Diagnosis not present

## 2023-11-04 DIAGNOSIS — M722 Plantar fascial fibromatosis: Secondary | ICD-10-CM | POA: Diagnosis not present

## 2023-11-04 DIAGNOSIS — I10 Essential (primary) hypertension: Secondary | ICD-10-CM | POA: Diagnosis not present

## 2024-01-04 DIAGNOSIS — Z1330 Encounter for screening examination for mental health and behavioral disorders, unspecified: Secondary | ICD-10-CM | POA: Diagnosis not present

## 2024-01-04 DIAGNOSIS — M25561 Pain in right knee: Secondary | ICD-10-CM | POA: Diagnosis not present

## 2024-01-05 DIAGNOSIS — R52 Pain, unspecified: Secondary | ICD-10-CM | POA: Diagnosis not present

## 2024-01-05 DIAGNOSIS — M1711 Unilateral primary osteoarthritis, right knee: Secondary | ICD-10-CM | POA: Diagnosis not present

## 2024-03-07 DIAGNOSIS — E785 Hyperlipidemia, unspecified: Secondary | ICD-10-CM | POA: Diagnosis not present

## 2024-03-07 DIAGNOSIS — I10 Essential (primary) hypertension: Secondary | ICD-10-CM | POA: Diagnosis not present

## 2024-03-21 DIAGNOSIS — I10 Essential (primary) hypertension: Secondary | ICD-10-CM | POA: Diagnosis not present

## 2024-03-31 DIAGNOSIS — E785 Hyperlipidemia, unspecified: Secondary | ICD-10-CM | POA: Diagnosis not present

## 2024-03-31 DIAGNOSIS — Z0000 Encounter for general adult medical examination without abnormal findings: Secondary | ICD-10-CM | POA: Diagnosis not present

## 2024-03-31 DIAGNOSIS — E119 Type 2 diabetes mellitus without complications: Secondary | ICD-10-CM | POA: Diagnosis not present

## 2024-03-31 DIAGNOSIS — Z6830 Body mass index (BMI) 30.0-30.9, adult: Secondary | ICD-10-CM | POA: Diagnosis not present

## 2024-03-31 DIAGNOSIS — I10 Essential (primary) hypertension: Secondary | ICD-10-CM | POA: Diagnosis not present

## 2024-05-07 DIAGNOSIS — H2513 Age-related nuclear cataract, bilateral: Secondary | ICD-10-CM | POA: Diagnosis not present

## 2024-05-07 DIAGNOSIS — H40033 Anatomical narrow angle, bilateral: Secondary | ICD-10-CM | POA: Diagnosis not present

## 2024-06-16 ENCOUNTER — Telehealth: Payer: Self-pay | Admitting: Pharmacist

## 2024-06-16 NOTE — Telephone Encounter (Signed)
 Pharmacy Quality Measure Review   This patient is appearing on a report for being at risk of failing the adherence measure for hypertension (ACEi/ARB) medications this calendar year.   Medication: Valsartan 40mg   Last fill date: 04/23/24 for 30 day supply   Left voicemail for patient to return my call at their convenience.   Lum Ricks, PharmD Candidate  [facility] [address] [facility] [employer]    Pt has not seen Dr. Purcell since 2022 - appears she now sees a different PCP at [facility]. Valsartan not on current med list.  Darrelyn Drum, PharmD, BCPS, CPP Clinical Pharmacist Practitioner [facility] [phone]

## 2024-09-29 DIAGNOSIS — E785 Hyperlipidemia, unspecified: Secondary | ICD-10-CM | POA: Diagnosis not present

## 2024-09-29 DIAGNOSIS — Z1211 Encounter for screening for malignant neoplasm of colon: Secondary | ICD-10-CM | POA: Diagnosis not present

## 2024-09-29 DIAGNOSIS — Z0000 Encounter for general adult medical examination without abnormal findings: Secondary | ICD-10-CM | POA: Diagnosis not present

## 2024-09-29 DIAGNOSIS — E119 Type 2 diabetes mellitus without complications: Secondary | ICD-10-CM | POA: Diagnosis not present

## 2024-09-29 DIAGNOSIS — I10 Essential (primary) hypertension: Secondary | ICD-10-CM | POA: Diagnosis not present
